# Patient Record
Sex: Female | Born: 1965 | Race: White | Hispanic: No | Smoking: Never smoker
Health system: Southern US, Community
[De-identification: ages and names within clinical notes are randomized; demographics above are authoritative.]

## PROBLEM LIST (undated history)

## (undated) DIAGNOSIS — E785 Hyperlipidemia, unspecified: Secondary | ICD-10-CM

## (undated) DIAGNOSIS — D649 Anemia, unspecified: Secondary | ICD-10-CM

## (undated) DIAGNOSIS — C539 Malignant neoplasm of cervix uteri, unspecified: Secondary | ICD-10-CM

## (undated) DIAGNOSIS — E079 Disorder of thyroid, unspecified: Secondary | ICD-10-CM

## (undated) HISTORY — DX: Anemia, unspecified: D64.9

## (undated) HISTORY — DX: Disorder of thyroid, unspecified: E07.9

## (undated) HISTORY — DX: Malignant neoplasm of cervix uteri, unspecified: C53.9

## (undated) HISTORY — DX: Hyperlipidemia, unspecified: E78.5

## (undated) HISTORY — PX: CERVICAL BIOPSY  W/ LOOP ELECTRODE EXCISION: SUR135

## (undated) HISTORY — PX: WISDOM TOOTH EXTRACTION: SHX21

---

## 1999-06-21 ENCOUNTER — Other Ambulatory Visit: Admission: RE | Admit: 1999-06-21 | Discharge: 1999-06-21 | Payer: Self-pay | Admitting: Obstetrics & Gynecology

## 1999-06-22 ENCOUNTER — Encounter (INDEPENDENT_AMBULATORY_CARE_PROVIDER_SITE_OTHER): Payer: Self-pay

## 1999-06-22 ENCOUNTER — Other Ambulatory Visit: Admission: RE | Admit: 1999-06-22 | Discharge: 1999-06-22 | Payer: Self-pay | Admitting: Obstetrics & Gynecology

## 2000-10-30 ENCOUNTER — Encounter: Admission: RE | Admit: 2000-10-30 | Discharge: 2000-10-30 | Payer: Self-pay | Admitting: Family Medicine

## 2000-10-30 ENCOUNTER — Encounter: Payer: Self-pay | Admitting: Family Medicine

## 2001-01-07 ENCOUNTER — Other Ambulatory Visit: Admission: RE | Admit: 2001-01-07 | Discharge: 2001-01-07 | Payer: Self-pay | Admitting: *Deleted

## 2001-01-07 ENCOUNTER — Encounter (INDEPENDENT_AMBULATORY_CARE_PROVIDER_SITE_OTHER): Payer: Self-pay | Admitting: Specialist

## 2001-03-12 ENCOUNTER — Encounter (INDEPENDENT_AMBULATORY_CARE_PROVIDER_SITE_OTHER): Payer: Self-pay

## 2001-03-12 ENCOUNTER — Other Ambulatory Visit: Admission: RE | Admit: 2001-03-12 | Discharge: 2001-03-12 | Payer: Self-pay | Admitting: Obstetrics & Gynecology

## 2001-07-10 ENCOUNTER — Other Ambulatory Visit: Admission: RE | Admit: 2001-07-10 | Discharge: 2001-07-10 | Payer: Self-pay | Admitting: Obstetrics & Gynecology

## 2001-12-01 ENCOUNTER — Other Ambulatory Visit: Admission: RE | Admit: 2001-12-01 | Discharge: 2001-12-01 | Payer: Self-pay | Admitting: Obstetrics & Gynecology

## 2006-11-07 ENCOUNTER — Encounter: Admission: RE | Admit: 2006-11-07 | Discharge: 2006-11-07 | Payer: Self-pay | Admitting: Family Medicine

## 2008-03-03 ENCOUNTER — Other Ambulatory Visit: Admission: RE | Admit: 2008-03-03 | Discharge: 2008-03-03 | Payer: Self-pay | Admitting: Family Medicine

## 2008-12-08 ENCOUNTER — Encounter: Admission: RE | Admit: 2008-12-08 | Discharge: 2008-12-08 | Payer: Self-pay

## 2014-01-13 ENCOUNTER — Ambulatory Visit: Payer: Self-pay | Admitting: Family Medicine

## 2014-01-13 ENCOUNTER — Ambulatory Visit: Payer: Self-pay

## 2014-01-13 VITALS — BP 90/60 | HR 77 | Temp 97.9°F | Resp 16 | Ht 61.0 in | Wt 162.0 lb

## 2014-01-13 DIAGNOSIS — M25519 Pain in unspecified shoulder: Secondary | ICD-10-CM

## 2014-01-13 DIAGNOSIS — M7540 Impingement syndrome of unspecified shoulder: Secondary | ICD-10-CM

## 2014-01-13 DIAGNOSIS — M7580 Other shoulder lesions, unspecified shoulder: Secondary | ICD-10-CM

## 2014-01-13 MED ORDER — MELOXICAM 7.5 MG PO TABS: 7.5000 mg | ORAL_TABLET | Freq: Every day | ORAL | Status: DC

## 2014-01-13 NOTE — Progress Notes (Signed)
      Chief Complaint:  Chief Complaint  Patient presents with  . Shoulder Pain    left shoulder injured it a few months ago  . Allergies    chest congestion    HPI: Kelsey Martinez is a 48 y.o. female who is here for  Left shoulder pain since Christmas after falling on ice, 4 months after direct fall onto shoulder she continues to have pain in the msk. Has had problems with overhead motion, combing hair, and also weakness with picking things up. Has not tried anythign for this. She has been tolerating it ok. Denies numbness, weakness, tingling.  Also complains of allergy symptoms. No sob, wheezing. Nonsmoker, no sore throat.   History reviewed. No pertinent past medical history. History reviewed. No pertinent past surgical history. History   Social History  . Marital Status: Divorced    Spouse Name: N/A    Number of Children: N/A  . Years of Education: N/A   Social History Main Topics  . Smoking status: Never Smoker   . Smokeless tobacco: None  . Alcohol Use: None  . Drug Use: None  . Sexual Activity: None   Other Topics Concern  . None   Social History Narrative  . None   Family History  Problem Relation Age of Onset  . Healthy Mother   . Cancer Father     Kidney cancer  . Healthy Sister    No Known Allergies Prior to Admission medications   Not on File     ROS: The patient denies fevers, chills, night sweats, unintentional weight loss, chest pain, palpitations, wheezing, dyspnea on exertion, nausea, vomiting, abdominal pain, dysuria, hematuria, melena, numbness, weakness, or tingling.   All other systems have been reviewed and were otherwise negative with the exception of those mentioned in the HPI and as above.    PHYSICAL EXAM: Filed Vitals:   01/13/14 1042  BP: 90/60  Pulse: 77  Temp: 97.9 F (36.6 C)  Resp: 16   Filed Vitals:   01/13/14 1042  Height: 5\' 1"  (1.549 m)  Weight: 162 lb (73.483 kg)   Body mass index is 30.63 kg/(m^2).  General:  Alert, no acute distress HEENT:  Normocephalic, atraumatic, oropharynx patent. EOMI, PERRLA. TM nl, boggy nares Cardiovascular:  Regular rate and rhythm, no rubs murmurs or gallops.  No Carotid bruits, radial pulse intact. No pedal edema.  Respiratory: Clear to auscultation bilaterally.  No wheezes, rales, or rhonchi.  No cyanosis, no use of accessory musculature GI: No organomegaly, abdomen is soft and non-tender, positive bowel sounds.  No masses. Skin: No rashes. Neurologic: Facial musculature symmetric. Psychiatric: Patient is appropriate throughout our interaction. Lymphatic: No cervical lymphadenopathy Musculoskeletal: Gait intact. Left-decreae ROM in IR Neg empty can 5/5 strength Tender along upper biceps.     LABS: No results found for this or any previous visit.   EKG/XRAY:   Primary read interpreted by Dr. Marin Comment at [facility]. Neg fracture/dislocation   ASSESSMENT/PLAN: Encounter Diagnoses  Name Primary?  . Shoulder pain Yes  . Rotator cuff tendonitis   . Rotator cuff impingement syndrome    Mobic prn Tylenol prn Decline other meds OTC nasocort F/u in 1 month  Gross sideeffects, risk and benefits, and alternatives of medications d/w patient. Patient is aware that all medications have potential sideeffects and we are unable to predict every sideeffect or drug-drug interaction that may occur.  Ebin Palazzi P Jshaun Abernathy, DO 01/13/2014 1:00 PM

## 2014-01-13 NOTE — Patient Instructions (Signed)

## 2015-11-15 ENCOUNTER — Ambulatory Visit (INDEPENDENT_AMBULATORY_CARE_PROVIDER_SITE_OTHER): Payer: BLUE CROSS/BLUE SHIELD | Admitting: Physician Assistant

## 2015-11-15 VITALS — BP 122/68 | HR 88 | Temp 98.7°F | Resp 17 | Ht 61.0 in | Wt 176.4 lb

## 2015-11-15 DIAGNOSIS — R05 Cough: Secondary | ICD-10-CM

## 2015-11-15 DIAGNOSIS — R059 Cough, unspecified: Secondary | ICD-10-CM

## 2015-11-15 LAB — POCT CBC
GRANULOCYTE PERCENT: 68.1 % (ref 37–80)
HCT, POC: 35.4 % — AB (ref 37.7–47.9)
HEMOGLOBIN: 11.5 g/dL — AB (ref 12.2–16.2)
Lymph, poc: 0.9 (ref 0.6–3.4)
MCH: 25.9 pg — AB (ref 27–31.2)
MCHC: 32.5 g/dL (ref 31.8–35.4)
MCV: 79.5 fL — AB (ref 80–97)
MID (CBC): 0.3 (ref 0–0.9)
MPV: 6 fL (ref 0–99.8)
PLATELET COUNT, POC: 280 10*3/uL (ref 142–424)
POC Granulocyte: 2.6 (ref 2–6.9)
POC LYMPH PERCENT: 22.7 %L (ref 10–50)
POC MID %: 9.2 % (ref 0–12)
RBC: 4.45 M/uL (ref 4.04–5.48)
RDW, POC: 16.9 %
WBC: 3.8 10*3/uL — AB (ref 4.6–10.2)

## 2015-11-15 MED ORDER — HYDROCODONE-HOMATROPINE 5-1.5 MG/5ML PO SYRP: 5.0000 mL | ORAL_SOLUTION | Freq: Three times a day (TID) | ORAL | Status: DC | PRN

## 2015-11-15 NOTE — Patient Instructions (Signed)
-   You have a viral upper respiratory infection, (the common cold) and it is not uncommon for symptoms to last 10 days to 2 weeks, regardless of which medications you take. Unfortunately antibiotics will not help your symptoms as they target bacteria, and you are likely suffering from a virus. Additionally, 1 in 8 people who take antibiotics suffer mild to severe side effects.    - You would benefit from high dose ibuprofen. TAKE 600-800 mg of ibuprofen every 8 hours as needed to control low grade fever, fatigue, and pain.    - I also recommend that you take Zyrtec-D 5-120 every morning or Claritin-D 10-240 for the next five to 10 days. This medication will help you with nasal congestion, post nasal drip and sneezing. You will have to purchase this directly from the pharmacist   Viral URI Medications: Please consult the pharmacist if you have questions. 600-800 mg ibuprofen every 8 hours as needed for the next five to ten days 5-120 Zyrtec-D every morning for five to 10 days OR Claritin D 10-240 every morning for five days.  Please visit the CDC's website [employer].cy to learn about appropriate and inappropriate uses of antibiotics.

## 2015-11-15 NOTE — Progress Notes (Signed)
   11/21/2015 7:15 PM   DOB: 18-Nov-1965 / MRN: [id number]  SUBJECTIVE:  Kelsey Martinez is a 50 y.o. female presenting for nasal congestion and runny nose that started 7 days ago.  Associated HA, chill, bad cough.  Has also been having loose stools.  Also associates some stress incontinence.  She has tried delsym for the cough and benadryl which have helped minimally.  She is a never smoker.  She denies a history of asthma.  She has not had the seasonal flu shot.    She has No Known Allergies.   She  has no past medical history on file.    She  reports that she has never smoked. She does not have any smokeless tobacco history on file. She  has no sexual activity history on file. The patient  has no past surgical history on file.  Her family history includes Cancer in her father; Healthy in her mother and sister.  Review of Systems  Constitutional: Negative for fever and chills.  Eyes: Negative for blurred vision.  Respiratory: Negative for cough and shortness of breath.   Cardiovascular: Negative for chest pain.  Gastrointestinal: Negative for nausea and abdominal pain.  Genitourinary: Negative for dysuria, urgency and frequency.  Musculoskeletal: Negative for myalgias.  Skin: Negative for rash.  Neurological: Negative for dizziness, tingling and headaches.  Psychiatric/Behavioral: Negative for depression. The patient is not nervous/anxious.     Problem list and medications reviewed and updated by myself where necessary, and exist elsewhere in the encounter.   OBJECTIVE:  BP 122/68 mmHg  Pulse 88  Temp(Src) 98.7 F (37.1 C) (Oral)  Resp 17  Ht 5\' 1"  (1.549 m)  Wt 176 lb 6.4 oz (80.015 kg)  BMI 33.35 kg/m2  SpO2 93%  LMP 12/16/2013  Physical Exam  Constitutional: She is oriented to person, place, and time. She appears well-developed.  Eyes: EOM are normal. Pupils are equal, round, and reactive to light.  Cardiovascular: Normal rate.   Pulmonary/Chest: Effort normal.  Abdominal:  She exhibits no distension.  Musculoskeletal: Normal range of motion.  Neurological: She is alert and oriented to person, place, and time. No cranial nerve deficit.  Skin: Skin is warm and dry. She is not diaphoretic.  Psychiatric: She has a normal mood and affect.  Vitals reviewed.   CBC    Component Value Date/Time   WBC 3.8* 11/15/2015 1126   RBC 4.45 11/15/2015 1126   HGB 11.5* 11/15/2015 1126   HCT 35.4* 11/15/2015 1126   MCV 79.5* 11/15/2015 1126   MCH 25.9* 11/15/2015 1126   MCHC 32.5 11/15/2015 1126      No results found.  ASSESSMENT AND PLAN  Kelsey Martinez was seen today for sinusitis and uri.  Diagnoses and all orders for this visit:  Cough: CBC reassuring.  This is most likely post viral cough syndrome.  Will treat symptomatically.  -     POCT CBC -     HYDROcodone-homatropine (HYCODAN) 5-1.5 MG/5ML syrup; Take 5 mLs by mouth every 8 (eight) hours as needed for cough.    The patient was advised to call or return to clinic if she does not see an improvement in symptoms or to seek the care of the closest emergency department if she worsens with the above plan.   Philis Fendt, MHS, PA-C [facility] 11/21/2015 7:15 PM

## 2015-12-05 ENCOUNTER — Encounter: Payer: Self-pay | Admitting: Medical

## 2015-12-05 ENCOUNTER — Encounter: Payer: Self-pay | Admitting: Gastroenterology

## 2015-12-05 ENCOUNTER — Ambulatory Visit (INDEPENDENT_AMBULATORY_CARE_PROVIDER_SITE_OTHER): Payer: BLUE CROSS/BLUE SHIELD | Admitting: Medical

## 2015-12-05 VITALS — BP 90/60 | HR 80 | Temp 97.7°F | Resp 16 | Ht 60.5 in | Wt 179.0 lb

## 2015-12-05 DIAGNOSIS — N393 Stress incontinence (female) (male): Secondary | ICD-10-CM

## 2015-12-05 DIAGNOSIS — D649 Anemia, unspecified: Secondary | ICD-10-CM

## 2015-12-05 DIAGNOSIS — Z8742 Personal history of other diseases of the female genital tract: Secondary | ICD-10-CM | POA: Diagnosis not present

## 2015-12-05 DIAGNOSIS — Z1211 Encounter for screening for malignant neoplasm of colon: Secondary | ICD-10-CM | POA: Diagnosis not present

## 2015-12-05 DIAGNOSIS — Z23 Encounter for immunization: Secondary | ICD-10-CM

## 2015-12-05 LAB — IRON AND TIBC
%SAT: 7 % — ABNORMAL LOW (ref 11–50)
Iron: 29 ug/dL — ABNORMAL LOW (ref 45–160)
TIBC: 390 ug/dL (ref 250–450)
UIBC: 361 ug/dL (ref 125–400)

## 2015-12-05 LAB — CBC WITH DIFFERENTIAL/PLATELET
BASOS PCT: 0.6 % (ref 0.0–3.0)
Basophils Absolute: 0 10*3/uL (ref 0.0–0.1)
EOS ABS: 0.1 10*3/uL (ref 0.0–0.7)
EOS PCT: 1.7 % (ref 0.0–5.0)
HEMATOCRIT: 32.9 % — AB (ref 36.0–46.0)
HEMOGLOBIN: 10.8 g/dL — AB (ref 12.0–15.0)
LYMPHS PCT: 37.5 % (ref 12.0–46.0)
Lymphs Abs: 2.1 10*3/uL (ref 0.7–4.0)
MCHC: 32.7 g/dL (ref 30.0–36.0)
MCV: 79.7 fl (ref 78.0–100.0)
Monocytes Absolute: 0.3 10*3/uL (ref 0.1–1.0)
Monocytes Relative: 5.3 % (ref 3.0–12.0)
Neutro Abs: 3 10*3/uL (ref 1.4–7.7)
Neutrophils Relative %: 54.9 % (ref 43.0–77.0)
Platelets: 341 10*3/uL (ref 150.0–400.0)
RBC: 4.13 Mil/uL (ref 3.87–5.11)
RDW: 16.8 % — AB (ref 11.5–15.5)
WBC: 5.5 10*3/uL (ref 4.0–10.5)

## 2015-12-05 LAB — FERRITIN: Ferritin: 5.8 ng/mL — ABNORMAL LOW (ref 10.0–291.0)

## 2015-12-05 MED ORDER — OXYBUTYNIN CHLORIDE 5 MG PO TABS: 5.0000 mg | ORAL_TABLET | Freq: Two times a day (BID) | ORAL | Status: DC

## 2015-12-05 NOTE — Progress Notes (Signed)
Pre visit review using our clinic review tool, if applicable. No additional management support is needed unless otherwise documented below in the visit note. 

## 2015-12-05 NOTE — Patient Instructions (Addendum)
Will get labs today for anemia.   Refer to gyn for pap, and mammogram.  Refer to GI for colonoscopy.  You do describe stress incontinence. May some urge component. Will rx ditropan as trial.  Tdap given.   Follow up in 3 wks for CPE. Come in fasting   Kegel Exercises The goal of Kegel exercises is to isolate and exercise your pelvic floor muscles. These muscles act as a hammock that supports the rectum, vagina, small intestine, and uterus. As the muscles weaken, the hammock sags and these organs are displaced from their normal positions. Kegel exercises can strengthen your pelvic floor muscles and help you to improve bladder and bowel control, improve sexual response, and help reduce many problems and some discomfort during pregnancy. Kegel exercises can be done anywhere and at any time. HOW TO PERFORM KEGEL EXERCISES 1. Locate your pelvic floor muscles. To do this, squeeze (contract) the muscles that you use when you try to stop the flow of urine. You will feel a tightness in the vaginal area (women) and a tight lift in the rectal area (men and women). 2. When you begin, contract your pelvic muscles tight for 2-5 seconds, then relax them for 2-5 seconds. This is one set. Do 4-5 sets with a short pause in between. 3. Contract your pelvic muscles for 8-10 seconds, then relax them for 8-10 seconds. Do 4-5 sets. If you cannot contract your pelvic muscles for 8-10 seconds, try 5-7 seconds and work your way up to 8-10 seconds. Your goal is 4-5 sets of 10 contractions each day. Keep your stomach, buttocks, and legs relaxed during the exercises. Perform sets of both short and long contractions. Vary your positions. Perform these contractions 3-4 times per day. Perform sets while you are:   Lying in bed in the morning.  Standing at lunch.  Sitting in the late afternoon.  Lying in bed at night. You should do 40-50 contractions per day. Do not perform more Kegel exercises per day than  recommended. Overexercising can cause muscle fatigue. Continue these exercises for for at least 15-20 weeks or as directed by your caregiver.   This information is not intended to replace advice given to you by your health care provider. Make sure you discuss any questions you have with your health care provider.   Document Released: 09/10/2012 Document Revised: 10/15/2014 Document Reviewed: 09/10/2012 Elsevier Interactive Patient Education [employer].

## 2015-12-05 NOTE — Progress Notes (Signed)
Subjective:    Patient ID: Kelsey Martinez, female    DOB: [birth year], 50 y.o.   MRN: [id number]  HPI   Pt in for evaluation. First time. Pt states last seen in 2010. No physical done since then.  Pt does not exercise regularly but estimates exercises 3 times a week, Pt drinks Dr. Malachi Bonds 2 a day, Pt attempting to eat healthy, married(6 year grandson lives with her)  Pt has history of cervical cancer. Pt had procedure to remove large portion of cervix. Then she states tissue can back and tested. No further cancer cells found. This was done in 2000. Pt states last pap was in 2010. None since.  Pt states faint mild anemia found in early February(when blood work done in urgent care. She had some cold symptoms at that time and dizzy.). Sometimes dizzy on bending over and standing up quickly.   Also some room spinning when she lies flat on her back and then turns her head. Will last 1-2 minutes then will resolve. Seemed to follow cold.  Pt currently denies any allergy symptoms.  Pt willing to get tdap vaccine today. Pt declines flu vaccine.  Pt states some occasional leaking when she coughs and sneezing. Going on for 20 years. Pt has done Kegel exercises.    Past Medical History  Diagnosis Date  . Cervical cancer ([place])   . Anemia     Social History   Social History  . Marital Status: Divorced    Spouse Name: N/A  . Number of Children: N/A  . Years of Education: N/A   Occupational History  . Not on file.   Social History Main Topics  . Smoking status: Never Smoker   . Smokeless tobacco: Not on file  . Alcohol Use: No  . Drug Use: No  . Sexual Activity: Yes   Other Topics Concern  . Not on file   Social History Narrative    Past Surgical History  Procedure Laterality Date  . Cervical biopsy  w/ loop electrode excision      this is what pt describes.    Family History  Problem Relation Age of Onset  . Healthy Mother   . Cancer Father     Kidney cancer  . Healthy  Sister     No Known Allergies  No current outpatient prescriptions on file prior to visit.   No current facility-administered medications on file prior to visit.    BP 90/60 mmHg  Pulse 80  Temp(Src) 97.7 F (36.5 C) (Oral)  Resp 16  Ht 5' 0.5" (1.537 m)  Wt 179 lb (81.194 kg)  BMI 34.37 kg/m2  SpO2 98%  LMP 12/16/2013     Review of Systems  Constitutional: Negative for fever, chills and fatigue.  Respiratory: Negative for cough, chest tightness, shortness of breath and wheezing.   Cardiovascular: Negative for chest pain and palpitations.  Gastrointestinal: Negative for abdominal pain.  Genitourinary: Negative for urgency, enuresis, difficulty urinating and genital sores.       Occasional incontinence on urinating.  Musculoskeletal: Negative for back pain and arthralgias.  Skin: Negative for rash.  Neurological: Positive for dizziness. Negative for headaches.       See hpi  Hematological: Negative for adenopathy. Does not bruise/bleed easily.  Psychiatric/Behavioral: Negative for behavioral problems and confusion.       Objective:   Physical Exam   General Mental Status- Alert. General Appearance- Not in acute distress.   Skin General: Color- Normal Color. Moisture- Normal  Moisture.  Neck Carotid Arteries- Normal color. Moisture- Normal Moisture. No carotid bruits. No JVD.  Chest and Lung Exam Auscultation: Breath Sounds:-Normal.  Cardiovascular Auscultation:Rythm- Regular. Murmurs & Other Heart Sounds:Auscultation of the heart reveals- No Murmurs.  Abdomen Inspection:-Inspeection Normal. Palpation/Percussion:Note:No mass. Palpation and Percussion of the abdomen reveal- Non Tender, Non Distended + BS, no rebound or guarding.    Neurologic Cranial Nerve exam:- CN III-XII intact(No nystagmus), symmetric smile. Strength:- 5/5 equal and symmetric strength both upper and lower extremities. Lying supine. She will get vertigo for 30 seconds at best then  subsides.     Assessment & Plan:  Will get labs today for anemia.   Refer to gyn for pap, and mammogram.  Refer to GI for colonoscopy.  You do describe stress incontinence. May some urge component. Will rx ditropan as trial.  Tdap given.   Follow up in 3 wks for CPE. Come in fasting

## 2015-12-06 ENCOUNTER — Telehealth: Payer: Self-pay | Admitting: Medical

## 2015-12-06 MED ORDER — FERROUS SULFATE 325 (65 FE) MG PO TABS: 325.0000 mg | ORAL_TABLET | Freq: Two times a day (BID) | ORAL | Status: DC

## 2015-12-06 NOTE — Telephone Encounter (Signed)
rx iron sent in.

## 2015-12-13 ENCOUNTER — Other Ambulatory Visit: Payer: Self-pay | Admitting: Obstetrics and Gynecology

## 2015-12-13 DIAGNOSIS — N6315 Unspecified lump in the right breast, overlapping quadrants: Secondary | ICD-10-CM

## 2015-12-13 DIAGNOSIS — N6310 Unspecified lump in the right breast, unspecified quadrant: Principal | ICD-10-CM

## 2015-12-14 LAB — HM DEXA SCAN

## 2015-12-16 ENCOUNTER — Ambulatory Visit
Admission: RE | Admit: 2015-12-16 | Discharge: 2015-12-16 | Disposition: A | Discharge status: Home / Self Care | Payer: BLUE CROSS/BLUE SHIELD | Source: Ambulatory Visit | Attending: Obstetrics and Gynecology | Admitting: Obstetrics and Gynecology

## 2015-12-16 DIAGNOSIS — N6310 Unspecified lump in the right breast, unspecified quadrant: Principal | ICD-10-CM

## 2015-12-16 DIAGNOSIS — N6315 Unspecified lump in the right breast, overlapping quadrants: Secondary | ICD-10-CM

## 2015-12-16 LAB — HM PAP SMEAR: HM Pap smear: NORMAL

## 2015-12-23 ENCOUNTER — Telehealth: Payer: Self-pay | Admitting: Behavioral Health

## 2015-12-23 ENCOUNTER — Telehealth: Payer: Self-pay | Admitting: Medical

## 2015-12-23 ENCOUNTER — Encounter: Payer: Self-pay | Admitting: Behavioral Health

## 2015-12-23 MED ORDER — FERROUS SULFATE 325 (65 FE) MG PO TABS: 325.0000 mg | ORAL_TABLET | Freq: Two times a day (BID) | ORAL | Status: DC

## 2015-12-23 NOTE — Telephone Encounter (Signed)
i don't remember her telling me she has low vitamin D. It would depend if she is low. I don't see I did vitamin d test. Does she have insurance? If she does not then I quess we could go ahead and order test. Still not sure what code epic would take. I usually use osteopenia, osteoporosis, or ovarian failure(has she gone through menopause?0. Would like to use something that she has. First need to know if has low vitamin d dx. If so then we could use that dx. Does she have old value of vitamin d/lab work.

## 2015-12-23 NOTE — Telephone Encounter (Signed)
Relation to [id number] Call back number: Pharmacy: [id number] - [place], [place]. [phone] (Phone) [phone] (Fax)         Reason for call:  Patient states her Vitamin D medication was thrown away requesting Rx please send to retail. (medicaiton list doesn't reflect Rx)

## 2015-12-23 NOTE — Telephone Encounter (Signed)
Kelsey Martinez please advise if ok to send in iron rx, Its not showing on current medication list.

## 2015-12-23 NOTE — Telephone Encounter (Signed)
I did send in iron to her pharmacy. It is written as twice daily. Can take it that way provided not getting constipated. If she is constipated can cut back to one tablet a day. Her anemia is not that low so maybe on tablet enough. But please remind her and document that I wanted her to get ifob. Make sure no blood in her stool. Part of anemia work up. Repeat cbc and iron studies in 2 months. Sooner if she is feeling fatigued.

## 2015-12-23 NOTE — Telephone Encounter (Signed)
Pre-Visit Call completed with patient and chart updated.   Pre-Visit Info documented in Specialty Comments under [employer].    

## 2015-12-23 NOTE — Telephone Encounter (Signed)
I meant to put in Vit. D. Does she need to take otc vit d or does she need an RX?

## 2015-12-23 NOTE — Telephone Encounter (Signed)
Spoke with pt and she states that she found Rx and did not need a refill at this time. Pt will call back if she needs any refills. Pt states that she called earlier and advised front that she would not need a call back.

## 2015-12-26 ENCOUNTER — Ambulatory Visit (INDEPENDENT_AMBULATORY_CARE_PROVIDER_SITE_OTHER): Payer: BLUE CROSS/BLUE SHIELD | Admitting: Medical

## 2015-12-26 ENCOUNTER — Telehealth: Payer: Self-pay | Admitting: Medical

## 2015-12-26 ENCOUNTER — Encounter: Payer: Self-pay | Admitting: Medical

## 2015-12-26 VITALS — BP 88/60 | HR 66 | Temp 97.9°F | Ht 61.0 in | Wt 176.4 lb

## 2015-12-26 DIAGNOSIS — Z0000 Encounter for general adult medical examination without abnormal findings: Secondary | ICD-10-CM

## 2015-12-26 DIAGNOSIS — Z113 Encounter for screening for infections with a predominantly sexual mode of transmission: Secondary | ICD-10-CM

## 2015-12-26 DIAGNOSIS — L989 Disorder of the skin and subcutaneous tissue, unspecified: Secondary | ICD-10-CM

## 2015-12-26 LAB — CBC WITH DIFFERENTIAL/PLATELET
BASOS PCT: 0.6 % (ref 0.0–3.0)
Basophils Absolute: 0 10*3/uL (ref 0.0–0.1)
EOS ABS: 0.1 10*3/uL (ref 0.0–0.7)
Eosinophils Relative: 1.3 % (ref 0.0–5.0)
HCT: 37 % (ref 36.0–46.0)
HEMOGLOBIN: 12 g/dL (ref 12.0–15.0)
LYMPHS ABS: 2.3 10*3/uL (ref 0.7–4.0)
Lymphocytes Relative: 39 % (ref 12.0–46.0)
MCHC: 32.5 g/dL (ref 30.0–36.0)
MCV: 82.8 fl (ref 78.0–100.0)
MONO ABS: 0.3 10*3/uL (ref 0.1–1.0)
Monocytes Relative: 4.8 % (ref 3.0–12.0)
NEUTROS PCT: 54.3 % (ref 43.0–77.0)
Neutro Abs: 3.2 10*3/uL (ref 1.4–7.7)
Platelets: 300 10*3/uL (ref 150.0–400.0)
RBC: 4.47 Mil/uL (ref 3.87–5.11)
RDW: 19.8 % — AB (ref 11.5–15.5)
WBC: 5.8 10*3/uL (ref 4.0–10.5)

## 2015-12-26 LAB — TSH: TSH: 6.33 u[IU]/mL — AB (ref 0.35–4.50)

## 2015-12-26 LAB — LIPID PANEL
CHOLESTEROL: 226 mg/dL — AB (ref 0–200)
HDL: 41.1 mg/dL (ref >39.00)
LDL CALC: 156 mg/dL — AB (ref 0–99)
NonHDL: 185.25
TRIGLYCERIDES: 148 mg/dL (ref 0.0–149.0)
Total CHOL/HDL Ratio: 6
VLDL: 29.6 mg/dL (ref 0.0–40.0)

## 2015-12-26 LAB — COMPREHENSIVE METABOLIC PANEL
ALBUMIN: 4.1 g/dL (ref 3.5–5.2)
ALT: 9 U/L (ref 0–35)
AST: 13 U/L (ref 0–37)
Alkaline Phosphatase: 53 U/L (ref 39–117)
BILIRUBIN TOTAL: 0.4 mg/dL (ref 0.2–1.2)
BUN: 12 mg/dL (ref 6–23)
CALCIUM: 9.1 mg/dL (ref 8.4–10.5)
CHLORIDE: 106 meq/L (ref 96–112)
CO2: 25 meq/L (ref 19–32)
Creatinine, Ser: 0.66 mg/dL (ref 0.40–1.20)
GFR: 100.51 mL/min (ref >60.00)
Glucose, Bld: 93 mg/dL (ref 70–99)
Potassium: 3.9 mEq/L (ref 3.5–5.1)
Sodium: 139 mEq/L (ref 135–145)
Total Protein: 6.9 g/dL (ref 6.0–8.3)

## 2015-12-26 LAB — HIV ANTIBODY (ROUTINE TESTING W REFLEX): HIV: NONREACTIVE

## 2015-12-26 MED ORDER — LEVOTHYROXINE SODIUM 75 MCG PO TABS: 75.0000 ug | ORAL_TABLET | Freq: Every day | ORAL | Status: DC

## 2015-12-26 NOTE — Assessment & Plan Note (Signed)
Cbc, cmp, tsh, lipid panel, hiv screen and ua.

## 2015-12-26 NOTE — Patient Instructions (Addendum)
Wellness examination Cbc, cmp, tsh, lipid panel, hiv screen and ua.  Would recommend you continue healthy diet and exercise. Will follow labs and notify you of the results.   We can repeat your vitamin D level that gyn did the other day.Can repeat it when you finish we vitamin D RX.Kelsey Martinez  Follow up date to be determined after lab review.       Preventive Care for Adults, Female A healthy lifestyle and preventive care can promote health and wellness. Preventive health guidelines for women include the following key practices.  A routine yearly physical is a good way to check with your health care provider about your health and preventive screening. It is a chance to share any concerns and updates on your health and to receive a thorough exam.  Visit your dentist for a routine exam and preventive care every 6 months. Brush your teeth twice a day and floss once a day. Good oral hygiene prevents tooth decay and gum disease.  The frequency of eye exams is based on your age, health, family medical history, use of contact lenses, and other factors. Follow your health care provider's recommendations for frequency of eye exams.  Eat a healthy diet. Foods like vegetables, fruits, whole grains, low-fat dairy products, and lean protein foods contain the nutrients you need without too many calories. Decrease your intake of foods high in solid fats, added sugars, and salt. Eat the right amount of calories for you.Get information about a proper diet from your health care provider, if necessary.  Regular physical exercise is one of the most important things you can do for your health. Most adults should get at least 150 minutes of moderate-intensity exercise (any activity that increases your heart rate and causes you to sweat) each week. In addition, most adults need muscle-strengthening exercises on 2 or more days a week.  Maintain a healthy weight. The body mass index (BMI) is a screening tool to identify  possible weight problems. It provides an estimate of body fat based on height and weight. Your health care provider can find your BMI and can help you achieve or maintain a healthy weight.For adults 20 years and older:  A BMI below 18.5 is considered underweight.  A BMI of 18.5 to 24.9 is normal.  A BMI of 25 to 29.9 is considered overweight.  A BMI of 30 and above is considered obese.  Maintain normal blood lipids and cholesterol levels by exercising and minimizing your intake of saturated fat. Eat a balanced diet with plenty of fruit and vegetables. Blood tests for lipids and cholesterol should begin at age 25 and be repeated every 5 years. If your lipid or cholesterol levels are high, you are over 50, or you are at high risk for heart disease, you may need your cholesterol levels checked more frequently.Ongoing high lipid and cholesterol levels should be treated with medicines if diet and exercise are not working.  If you smoke, find out from your health care provider how to quit. If you do not use tobacco, do not start.  Lung cancer screening is recommended for adults aged 36-80 years who are at high risk for developing lung cancer because of a history of smoking. A yearly low-dose CT scan of the lungs is recommended for people who have at least a 30-pack-year history of smoking and are a current smoker or have quit within the past 15 years. A pack year of smoking is smoking an average of 1 pack of cigarettes a  day for 1 year (for example: 1 pack a day for 30 years or 2 packs a day for 15 years). Yearly screening should continue until the smoker has stopped smoking for at least 15 years. Yearly screening should be stopped for people who develop a health problem that would prevent them from having lung cancer treatment.  If you are pregnant, do not drink alcohol. If you are breastfeeding, be very cautious about drinking alcohol. If you are not pregnant and choose to drink alcohol, do not have  more than 1 drink per day. One drink is considered to be 12 ounces (355 mL) of beer, 5 ounces (148 mL) of wine, or 1.5 ounces (44 mL) of liquor.  Avoid use of street drugs. Do not share needles with anyone. Ask for help if you need support or instructions about stopping the use of drugs.  High blood pressure causes heart disease and increases the risk of stroke. Your blood pressure should be checked at least every 1 to 2 years. Ongoing high blood pressure should be treated with medicines if weight loss and exercise do not work.  If you are 83-58 years old, ask your health care provider if you should take aspirin to prevent strokes.  Diabetes screening is done by taking a blood sample to check your blood glucose level after you have not eaten for a certain period of time (fasting). If you are not overweight and you do not have risk factors for diabetes, you should be screened once every 3 years starting at age 90. If you are overweight or obese and you are 59-27 years of age, you should be screened for diabetes every year as part of your cardiovascular risk assessment.  Breast cancer screening is essential preventive care for women. You should practice "breast self-awareness." This means understanding the normal appearance and feel of your breasts and may include breast self-examination. Any changes detected, no matter how small, should be reported to a health care provider. Women in their 49s and 30s should have a clinical breast exam (CBE) by a health care provider as part of a regular health exam every 1 to 3 years. After age 63, women should have a CBE every year. Starting at age 24, women should consider having a mammogram (breast X-ray test) every year. Women who have a family history of breast cancer should talk to their health care provider about genetic screening. Women at a high risk of breast cancer should talk to their health care providers about having an MRI and a mammogram every  year.  Breast cancer gene (BRCA)-related cancer risk assessment is recommended for women who have family members with BRCA-related cancers. BRCA-related cancers include breast, ovarian, tubal, and peritoneal cancers. Having family members with these cancers may be associated with an increased risk for harmful changes (mutations) in the breast cancer genes BRCA1 and BRCA2. Results of the assessment will determine the need for genetic counseling and BRCA1 and BRCA2 testing.  Your health care provider may recommend that you be screened regularly for cancer of the pelvic organs (ovaries, uterus, and vagina). This screening involves a pelvic examination, including checking for microscopic changes to the surface of your cervix (Pap test). You may be encouraged to have this screening done every 3 years, beginning at age 46.  For women ages 79-65, health care providers may recommend pelvic exams and Pap testing every 3 years, or they may recommend the Pap and pelvic exam, combined with testing for human papilloma virus (HPV), every  5 years. Some types of HPV increase your risk of cervical cancer. Testing for HPV may also be done on women of any age with unclear Pap test results.  Other health care providers may not recommend any screening for nonpregnant women who are considered low risk for pelvic cancer and who do not have symptoms. Ask your health care provider if a screening pelvic exam is right for you.  If you have had past treatment for cervical cancer or a condition that could lead to cancer, you need Pap tests and screening for cancer for at least 20 years after your treatment. If Pap tests have been discontinued, your risk factors (such as having a new sexual partner) need to be reassessed to determine if screening should resume. Some women have medical problems that increase the chance of getting cervical cancer. In these cases, your health care provider may recommend more frequent screening and Pap  tests.  Colorectal cancer can be detected and often prevented. Most routine colorectal cancer screening begins at the age of 56 years and continues through age 17 years. However, your health care provider may recommend screening at an earlier age if you have risk factors for colon cancer. On a yearly basis, your health care provider may provide home test kits to check for hidden blood in the stool. Use of a small camera at the end of a tube, to directly examine the colon (sigmoidoscopy or colonoscopy), can detect the earliest forms of colorectal cancer. Talk to your health care provider about this at age 37, when routine screening begins. Direct exam of the colon should be repeated every 5-10 years through age 59 years, unless early forms of precancerous polyps or small growths are found.  People who are at an increased risk for hepatitis B should be screened for this virus. You are considered at high risk for hepatitis B if:  You were born in a country where hepatitis B occurs often. Talk with your health care provider about which countries are considered high risk.  Your parents were born in a high-risk country and you have not received a shot to protect against hepatitis B (hepatitis B vaccine).  You have HIV or AIDS.  You use needles to inject street drugs.  You live with, or have sex with, someone who has hepatitis B.  You get hemodialysis treatment.  You take certain medicines for conditions like cancer, organ transplantation, and autoimmune conditions.  Hepatitis C blood testing is recommended for all people born from [birth year] through 1965 and any individual with known risks for hepatitis C.  Practice safe sex. Use condoms and avoid high-risk sexual practices to reduce the spread of sexually transmitted infections (STIs). STIs include gonorrhea, chlamydia, syphilis, trichomonas, herpes, HPV, and human immunodeficiency virus (HIV). Herpes, HIV, and HPV are viral illnesses that have no cure.  They can result in disability, cancer, and death.  You should be screened for sexually transmitted illnesses (STIs) including gonorrhea and chlamydia if:  You are sexually active and are younger than 24 years.  You are older than 24 years and your health care provider tells you that you are at risk for this type of infection.  Your sexual activity has changed since you were last screened and you are at an increased risk for chlamydia or gonorrhea. Ask your health care provider if you are at risk.  If you are at risk of being infected with HIV, it is recommended that you take a prescription medicine daily to prevent HIV  infection. This is called preexposure prophylaxis (PrEP). You are considered at risk if:  You are sexually active and do not regularly use condoms or know the HIV status of your partner(s).  You take drugs by injection.  You are sexually active with a partner who has HIV.  Talk with your health care provider about whether you are at high risk of being infected with HIV. If you choose to begin PrEP, you should first be tested for HIV. You should then be tested every 3 months for as long as you are taking PrEP.  Osteoporosis is a disease in which the bones lose minerals and strength with aging. This can result in serious bone fractures or breaks. The risk of osteoporosis can be identified using a bone density scan. Women ages 49 years and over and women at risk for fractures or osteoporosis should discuss screening with their health care providers. Ask your health care provider whether you should take a calcium supplement or vitamin D to reduce the rate of osteoporosis.  Menopause can be associated with physical symptoms and risks. Hormone replacement therapy is available to decrease symptoms and risks. You should talk to your health care provider about whether hormone replacement therapy is right for you.  Use sunscreen. Apply sunscreen liberally and repeatedly throughout the  day. You should seek shade when your shadow is shorter than you. Protect yourself by wearing long sleeves, pants, a wide-brimmed hat, and sunglasses year round, whenever you are outdoors.  Once a month, do a whole body skin exam, using a mirror to look at the skin on your back. Tell your health care provider of new moles, moles that have irregular borders, moles that are larger than a pencil eraser, or moles that have changed in shape or color.  Stay current with required vaccines (immunizations).  Influenza vaccine. All adults should be immunized every year.  Tetanus, diphtheria, and acellular pertussis (Td, Tdap) vaccine. Pregnant women should receive 1 dose of Tdap vaccine during each pregnancy. The dose should be obtained regardless of the length of time since the last dose. Immunization is preferred during the 27th-36th week of gestation. An adult who has not previously received Tdap or who does not know her vaccine status should receive 1 dose of Tdap. This initial dose should be followed by tetanus and diphtheria toxoids (Td) booster doses every 10 years. Adults with an unknown or incomplete history of completing a 3-dose immunization series with Td-containing vaccines should begin or complete a primary immunization series including a Tdap dose. Adults should receive a Td booster every 10 years.  Varicella vaccine. An adult without evidence of immunity to varicella should receive 2 doses or a second dose if she has previously received 1 dose. Pregnant females who do not have evidence of immunity should receive the first dose after pregnancy. This first dose should be obtained before leaving the health care facility. The second dose should be obtained 4-8 weeks after the first dose.  Human papillomavirus (HPV) vaccine. Females aged 13-26 years who have not received the vaccine previously should obtain the 3-dose series. The vaccine is not recommended for use in pregnant females. However, pregnancy  testing is not needed before receiving a dose. If a female is found to be pregnant after receiving a dose, no treatment is needed. In that case, the remaining doses should be delayed until after the pregnancy. Immunization is recommended for any person with an immunocompromised condition through the age of 55 years if she did not  get any or all doses earlier. During the 3-dose series, the second dose should be obtained 4-8 weeks after the first dose. The third dose should be obtained 24 weeks after the first dose and 16 weeks after the second dose.  Zoster vaccine. One dose is recommended for adults aged 68 years or older unless certain conditions are present.  Measles, mumps, and rubella (MMR) vaccine. Adults born before [birth year] generally are considered immune to measles and mumps. Adults born in [birth year] or later should have 1 or more doses of MMR vaccine unless there is a contraindication to the vaccine or there is laboratory evidence of immunity to each of the three diseases. A routine second dose of MMR vaccine should be obtained at least 28 days after the first dose for students attending postsecondary schools, health care workers, or international travelers. People who received inactivated measles vaccine or an unknown type of measles vaccine during 1963-1967 should receive 2 doses of MMR vaccine. People who received inactivated mumps vaccine or an unknown type of mumps vaccine before 1979 and are at high risk for mumps infection should consider immunization with 2 doses of MMR vaccine. For females of childbearing age, rubella immunity should be determined. If there is no evidence of immunity, females who are not pregnant should be vaccinated. If there is no evidence of immunity, females who are pregnant should delay immunization until after pregnancy. Unvaccinated health care workers born before [birth year] who lack laboratory evidence of measles, mumps, or rubella immunity or laboratory confirmation of disease should  consider measles and mumps immunization with 2 doses of MMR vaccine or rubella immunization with 1 dose of MMR vaccine.  Pneumococcal 13-valent conjugate (PCV13) vaccine. When indicated, a person who is uncertain of his immunization history and has no record of immunization should receive the PCV13 vaccine. All adults 38 years of age and older should receive this vaccine. An adult aged 47 years or older who has certain medical conditions and has not been previously immunized should receive 1 dose of PCV13 vaccine. This PCV13 should be followed with a dose of pneumococcal polysaccharide (PPSV23) vaccine. Adults who are at high risk for pneumococcal disease should obtain the PPSV23 vaccine at least 8 weeks after the dose of PCV13 vaccine. Adults older than 51 years of age who have normal immune system function should obtain the PPSV23 vaccine dose at least 1 year after the dose of PCV13 vaccine.  Pneumococcal polysaccharide (PPSV23) vaccine. When PCV13 is also indicated, PCV13 should be obtained first. All adults aged 74 years and older should be immunized. An adult younger than age 57 years who has certain medical conditions should be immunized. Any person who resides in a nursing home or long-term care facility should be immunized. An adult smoker should be immunized. People with an immunocompromised condition and certain other conditions should receive both PCV13 and PPSV23 vaccines. People with human immunodeficiency virus (HIV) infection should be immunized as soon as possible after diagnosis. Immunization during chemotherapy or radiation therapy should be avoided. Routine use of PPSV23 vaccine is not recommended for American Indians, [place] Natives, or people younger than 65 years unless there are medical conditions that require PPSV23 vaccine. When indicated, people who have unknown immunization and have no record of immunization should receive PPSV23 vaccine. One-time revaccination 5 years after the first  dose of PPSV23 is recommended for people aged 19-64 years who have chronic kidney failure, nephrotic syndrome, asplenia, or immunocompromised conditions. People who received 1-2 doses of PPSV23 before age  65 years should receive another dose of PPSV23 vaccine at age 57 years or later if at least 5 years have passed since the previous dose. Doses of PPSV23 are not needed for people immunized with PPSV23 at or after age 32 years.  Meningococcal vaccine. Adults with asplenia or persistent complement component deficiencies should receive 2 doses of quadrivalent meningococcal conjugate (MenACWY-D) vaccine. The doses should be obtained at least 2 months apart. Microbiologists working with certain meningococcal bacteria, military recruits, people at risk during an outbreak, and people who travel to or live in countries with a high rate of meningitis should be immunized. A first-year college student up through age 23 years who is living in a residence hall should receive a dose if she did not receive a dose on or after her 16th birthday. Adults who have certain high-risk conditions should receive one or more doses of vaccine.  Hepatitis A vaccine. Adults who wish to be protected from this disease, have certain high-risk conditions, work with hepatitis A-infected animals, work in hepatitis A research labs, or travel to or work in countries with a high rate of hepatitis A should be immunized. Adults who were previously unvaccinated and who anticipate close contact with an international adoptee during the first 60 days after arrival in the [place] from a country with a high rate of hepatitis A should be immunized.  Hepatitis B vaccine. Adults who wish to be protected from this disease, have certain high-risk conditions, may be exposed to blood or other infectious body fluids, are household contacts or sex partners of hepatitis B positive people, are clients or workers in certain care facilities, or travel to or  work in countries with a high rate of hepatitis B should be immunized.  Haemophilus influenzae type b (Hib) vaccine. A previously unvaccinated person with asplenia or sickle cell disease or having a scheduled splenectomy should receive 1 dose of Hib vaccine. Regardless of previous immunization, a recipient of a hematopoietic stem cell transplant should receive a 3-dose series 6-12 months after her successful transplant. Hib vaccine is not recommended for adults with HIV infection. Preventive Services / Frequency Ages 82 to 39 years  Blood pressure check.** / Every 3-5 years.  Lipid and cholesterol check.** / Every 5 years beginning at age 84.  Clinical breast exam.** / Every 3 years for women in their 50s and 30s.  BRCA-related cancer risk assessment.** / For women who have family members with a BRCA-related cancer (breast, ovarian, tubal, or peritoneal cancers).  Pap test.** / Every 2 years from ages 42 through 81. Every 3 years starting at age 46 through age 25 or 38 with a history of 3 consecutive normal Pap tests.  HPV screening.** / Every 3 years from ages 1 through ages 24 to 66 with a history of 3 consecutive normal Pap tests.  Hepatitis C blood test.** / For any individual with known risks for hepatitis C.  Skin self-exam. / Monthly.  Influenza vaccine. / Every year.  Tetanus, diphtheria, and acellular pertussis (Tdap, Td) vaccine.** / Consult your health care provider. Pregnant women should receive 1 dose of Tdap vaccine during each pregnancy. 1 dose of Td every 10 years.  Varicella vaccine.** / Consult your health care provider. Pregnant females who do not have evidence of immunity should receive the first dose after pregnancy.  HPV vaccine. / 3 doses over 6 months, if 26 and younger. The vaccine is not recommended for use in pregnant females. However, pregnancy testing is not needed  before receiving a dose.  Measles, mumps, rubella (MMR) vaccine.** / You need at least 1 dose  of MMR if you were born in 1957 or later. You may also need a 2nd dose. For females of childbearing age, rubella immunity should be determined. If there is no evidence of immunity, females who are not pregnant should be vaccinated. If there is no evidence of immunity, females who are pregnant should delay immunization until after pregnancy.  Pneumococcal 13-valent conjugate (PCV13) vaccine.** / Consult your health care provider.  Pneumococcal polysaccharide (PPSV23) vaccine.** / 1 to 2 doses if you smoke cigarettes or if you have certain conditions.  Meningococcal vaccine.** / 1 dose if you are age 19 to 68 years and a Market researcher living in a residence hall, or have one of several medical conditions, you need to get vaccinated against meningococcal disease. You may also need additional booster doses.  Hepatitis A vaccine.** / Consult your health care provider.  Hepatitis B vaccine.** / Consult your health care provider.  Haemophilus influenzae type b (Hib) vaccine.** / Consult your health care provider. Ages 51 to 31 years  Blood pressure check.** / Every year.  Lipid and cholesterol check.** / Every 5 years beginning at age 4 years.  Lung cancer screening. / Every year if you are aged 26-80 years and have a 30-pack-year history of smoking and currently smoke or have quit within the past 15 years. Yearly screening is stopped once you have quit smoking for at least 15 years or develop a health problem that would prevent you from having lung cancer treatment.  Clinical breast exam.** / Every year after age 32 years.  BRCA-related cancer risk assessment.** / For women who have family members with a BRCA-related cancer (breast, ovarian, tubal, or peritoneal cancers).  Mammogram.** / Every year beginning at age 58 years and continuing for as long as you are in good health. Consult with your health care provider.  Pap test.** / Every 3 years starting at age 38 years through age  70 or 45 years with a history of 3 consecutive normal Pap tests.  HPV screening.** / Every 3 years from ages 20 years through ages 55 to 95 years with a history of 3 consecutive normal Pap tests.  Fecal occult blood test (FOBT) of stool. / Every year beginning at age 18 years and continuing until age 67 years. You may not need to do this test if you get a colonoscopy every 10 years.  Flexible sigmoidoscopy or colonoscopy.** / Every 5 years for a flexible sigmoidoscopy or every 10 years for a colonoscopy beginning at age 69 years and continuing until age 2 years.  Hepatitis C blood test.** / For all people born from [birth year] through 1966 and any individual with known risks for hepatitis C.  Skin self-exam. / Monthly.  Influenza vaccine. / Every year.  Tetanus, diphtheria, and acellular pertussis (Tdap/Td) vaccine.** / Consult your health care provider. Pregnant women should receive 1 dose of Tdap vaccine during each pregnancy. 1 dose of Td every 10 years.  Varicella vaccine.** / Consult your health care provider. Pregnant females who do not have evidence of immunity should receive the first dose after pregnancy.  Zoster vaccine.** / 1 dose for adults aged 71 years or older.  Measles, mumps, rubella (MMR) vaccine.** / You need at least 1 dose of MMR if you were born in 1957 or later. You may also need a second dose. For females of childbearing age, rubella immunity should be  determined. If there is no evidence of immunity, females who are not pregnant should be vaccinated. If there is no evidence of immunity, females who are pregnant should delay immunization until after pregnancy.  Pneumococcal 13-valent conjugate (PCV13) vaccine.** / Consult your health care provider.  Pneumococcal polysaccharide (PPSV23) vaccine.** / 1 to 2 doses if you smoke cigarettes or if you have certain conditions.  Meningococcal vaccine.** / Consult your health care provider.  Hepatitis A vaccine.** / Consult your  health care provider.  Hepatitis B vaccine.** / Consult your health care provider.  Haemophilus influenzae type b (Hib) vaccine.** / Consult your health care provider. Ages 82 years and over  Blood pressure check.** / Every year.  Lipid and cholesterol check.** / Every 5 years beginning at age 74 years.  Lung cancer screening. / Every year if you are aged 33-80 years and have a 30-pack-year history of smoking and currently smoke or have quit within the past 15 years. Yearly screening is stopped once you have quit smoking for at least 15 years or develop a health problem that would prevent you from having lung cancer treatment.  Clinical breast exam.** / Every year after age 26 years.  BRCA-related cancer risk assessment.** / For women who have family members with a BRCA-related cancer (breast, ovarian, tubal, or peritoneal cancers).  Mammogram.** / Every year beginning at age 96 years and continuing for as long as you are in good health. Consult with your health care provider.  Pap test.** / Every 3 years starting at age 75 years through age 71 or 43 years with 3 consecutive normal Pap tests. Testing can be stopped between 65 and 70 years with 3 consecutive normal Pap tests and no abnormal Pap or HPV tests in the past 10 years.  HPV screening.** / Every 3 years from ages 50 years through ages 53 or 21 years with a history of 3 consecutive normal Pap tests. Testing can be stopped between 65 and 70 years with 3 consecutive normal Pap tests and no abnormal Pap or HPV tests in the past 10 years.  Fecal occult blood test (FOBT) of stool. / Every year beginning at age 59 years and continuing until age 35 years. You may not need to do this test if you get a colonoscopy every 10 years.  Flexible sigmoidoscopy or colonoscopy.** / Every 5 years for a flexible sigmoidoscopy or every 10 years for a colonoscopy beginning at age 48 years and continuing until age 74 years.  Hepatitis C blood test.** /  For all people born from [birth year] through 1966 and any individual with known risks for hepatitis C.  Osteoporosis screening.** / A one-time screening for women ages 62 years and over and women at risk for fractures or osteoporosis.  Skin self-exam. / Monthly.  Influenza vaccine. / Every year.  Tetanus, diphtheria, and acellular pertussis (Tdap/Td) vaccine.** / 1 dose of Td every 10 years.  Varicella vaccine.** / Consult your health care provider.  Zoster vaccine.** / 1 dose for adults aged 66 years or older.  Pneumococcal 13-valent conjugate (PCV13) vaccine.** / Consult your health care provider.  Pneumococcal polysaccharide (PPSV23) vaccine.** / 1 dose for all adults aged 52 years and older.  Meningococcal vaccine.** / Consult your health care provider.  Hepatitis A vaccine.** / Consult your health care provider.  Hepatitis B vaccine.** / Consult your health care provider.  Haemophilus influenzae type b (Hib) vaccine.** / Consult your health care provider. ** Family history and personal history of risk and  conditions may change your health care provider's recommendations.   This information is not intended to replace advice given to you by your health care provider. Make sure you discuss any questions you have with your health care provider.   Document Released: 11/20/2001 Document Revised: 10/15/2014 Document Reviewed: 02/19/2011 Elsevier Interactive Patient Education [employer].

## 2015-12-26 NOTE — Telephone Encounter (Signed)
Repeat tsh in 3 months.

## 2015-12-26 NOTE — Progress Notes (Signed)
Subjective:    Patient ID: Kelsey Martinez, female    DOB: 1965/12/23, 50 y.o.   MRN: [id number]  HPI Pt in for evaluation. First time. Pt states last seen in 2010. No physical done since then.  Pt does not exercise regularly but estimates exercises 3 times a week, Pt drinks Dr. Malachi Bonds 2 a day(Trying to cut back), Pt attempting to eat healthy, married(6 year grandson lives with her)  Pt just recently had gyn exam 12-16-2015. Also had mammogram done 12-16-2015.(studies exam)  Pt does need coloscopy. Overdue in August 2016. I referred he last week. Will get done on April 18,2017.  Needs hiv screen.  Up to date on tdap.  Pt is fasting.  Pt has history of low vitamin D level in past. They prescribed the vitamin D.  Also pt was put on progesterone by gyn.Only brief 7 days of medicine. Then menstruated. But then stopped.    Review of Systems  Constitutional: Negative for fever, chills, activity change and fatigue.  Respiratory: Negative for cough, chest tightness and shortness of breath.   Cardiovascular: Negative for chest pain, palpitations and leg swelling.  Gastrointestinal: Negative for nausea, vomiting and abdominal pain.  Musculoskeletal: Negative for neck pain and neck stiffness.       Occasional intermittent bilateral shoulder crepitus sounds and pain. Xray of left one done at Urgent care. [place]. She was given exercises today. Off and on pain for one year. No injury or fall. Pain just came on.  Neurological: Negative for dizziness, seizures, facial asymmetry, weakness, numbness and headaches.  Psychiatric/Behavioral: Negative for behavioral problems, confusion and agitation. The patient is not nervous/anxious.     Past Medical History  Diagnosis Date  . Cervical cancer ([place])   . Anemia     Social History   Social History  . Marital Status: Divorced    Spouse Name: N/A  . Number of Children: N/A  . Years of Education: N/A   Occupational History  . Not on file.    Social History Main Topics  . Smoking status: Never Smoker   . Smokeless tobacco: Not on file  . Alcohol Use: No  . Drug Use: No  . Sexual Activity: Yes   Other Topics Concern  . Not on file   Social History Narrative    Past Surgical History  Procedure Laterality Date  . Cervical biopsy  w/ loop electrode excision      this is what pt describes.    Family History  Problem Relation Age of Onset  . Healthy Mother   . Cancer Father     Kidney cancer  . Healthy Sister     No Known Allergies  Current Outpatient Prescriptions on File Prior to Visit  Medication Sig Dispense Refill  . Cholecalciferol (VITAMIN D3) 50000 units CAPS Take 1 capsule by mouth once a week.  0  . ferrous sulfate 325 (65 FE) MG tablet Take 1 tablet (325 mg total) by mouth 2 (two) times daily with a meal. 60 tablet 1   No current facility-administered medications on file prior to visit.    BP 88/60 mmHg  Pulse 66  Temp(Src) 97.9 F (36.6 C) (Oral)  Ht 5\' 1"  (1.549 m)  Wt 176 lb 6.4 oz (80.015 kg)  BMI 33.35 kg/m2  SpO2 98%  LMP 12/16/2013       Objective:   Physical Exam  General Mental Status- Alert. General Appearance- Not in acute distress.   Skin General: Color- Normal Color. Moisture-  Normal Moisture.  Neck Carotid Arteries- Normal color. Moisture- Normal Moisture. No carotid bruits. No JVD.  Chest and Lung Exam Auscultation: Breath Sounds:-Normal.  Cardiovascular Auscultation:Rythm- Regular. Murmurs & Other Heart Sounds:Auscultation of the heart reveals- No Murmurs.  Abdomen Inspection:-Inspeection Normal. Palpation/Percussion:Note:No mass. Palpation and Percussion of the abdomen reveal- Non Tender, Non Distended + BS, no rebound or guarding.   Abdomen Inspection:-Inspection Normal.  Palpation/Perucssion: Palpation and Percussion of the abdomen reveal- Non Tender, No Rebound tenderness, No rigidity(Guarding) and No Palpable abdominal masses.  Liver:-Normal.   Spleen:- Normal.   Skin- scattered moderate sized moles on her back. One left scapula region that itches. About 63mm in size and mild amorphous appearance.   Neurologic Cranial Nerve exam:- CN III-XII intact(No nystagmus), symmetric smile. Strength:- 5/5 equal and symmetric strength both upper and lower extremities.  Breast- deferred since done with gyn.  Pelvic- defered since done with gyn.  Lt shoulder- good rom. Mild tender to palpation over bicep tendon.    Assessment & Plan:  Note regarding her shoulder pain. If worsens in future will offer referal to sports med or orthopedist.

## 2015-12-26 NOTE — Progress Notes (Signed)
Pre visit review using our clinic review tool, if applicable. No additional management support is needed unless otherwise documented below in the visit note. 

## 2015-12-27 ENCOUNTER — Telehealth: Payer: Self-pay | Admitting: Medical

## 2015-12-27 NOTE — Addendum Note (Signed)
Addended by: Tasia Catchings on: 12/27/2015 10:25 AM   Modules accepted: Orders, Medications

## 2015-12-27 NOTE — Telephone Encounter (Signed)
So will you clarify with pt  that she has dx of hypothyroid? If so how much thyroid med/dose she is taking??

## 2015-12-28 NOTE — Telephone Encounter (Signed)
Pt states that she was at endocrinologist on 12/27/15 and they would manage thyroid levels.

## 2016-01-10 ENCOUNTER — Ambulatory Visit (AMBULATORY_SURGERY_CENTER): Payer: Self-pay | Admitting: *Deleted

## 2016-01-10 VITALS — Ht 60.0 in | Wt 175.8 lb

## 2016-01-10 DIAGNOSIS — Z1211 Encounter for screening for malignant neoplasm of colon: Secondary | ICD-10-CM

## 2016-01-10 MED ORDER — SUPREP BOWEL PREP KIT 17.5-3.13-1.6 GM/177ML PO SOLN: 1.0000 | Freq: Once | ORAL | Status: DC

## 2016-01-10 NOTE — Progress Notes (Signed)
Patient denies any allergies to egg or soy products. Patient denies complications with anesthesia/sedation.  Patient denies oxygen use at home and denies diet medications. Emmi instructions for colonoscopy explained but patient denied.     

## 2016-01-24 ENCOUNTER — Telehealth: Payer: Self-pay | Admitting: Medical

## 2016-01-24 ENCOUNTER — Encounter: Payer: Self-pay | Admitting: Gastroenterology

## 2016-01-24 ENCOUNTER — Ambulatory Visit (AMBULATORY_SURGERY_CENTER): Payer: BLUE CROSS/BLUE SHIELD | Admitting: Gastroenterology

## 2016-01-24 VITALS — BP 112/74 | HR 70 | Temp 98.4°F | Resp 12 | Ht 60.0 in | Wt 175.0 lb

## 2016-01-24 DIAGNOSIS — Z1211 Encounter for screening for malignant neoplasm of colon: Secondary | ICD-10-CM

## 2016-01-24 MED ORDER — ONDANSETRON HCL 4 MG PO TABS: 4.0000 mg | ORAL_TABLET | ORAL | Status: DC | PRN

## 2016-01-24 MED ORDER — SODIUM CHLORIDE 0.9 % IV SOLN: 500.0000 mL | INTRAVENOUS | Status: DC

## 2016-01-24 NOTE — Telephone Encounter (Signed)
I read the report. She had poor bowel prep. So looks likes she will have to repeat test.

## 2016-01-24 NOTE — Telephone Encounter (Signed)
I saw colonoscopy report but don't understand why health maintenance states she is still overdue. Will you abstract colonosopy. I think you can see same report??

## 2016-01-24 NOTE — Progress Notes (Signed)
Patient awakening,vss,report to rn 

## 2016-01-24 NOTE — Op Note (Signed)
[facility] Patient Name: Kelsey Martinez Procedure Date: 01/24/2016 8:44 AM MRN: [id number] Endoscopist: Mauri Pole , MD Age: 50 Date of Birth: 08/19/1966 Gender: Female Procedure:                Colonoscopy Indications:              Screening for colorectal malignant neoplasm Medicines:                Monitored Anesthesia Care Procedure:                Pre-Anesthesia Assessment:                           - Prior to the procedure, a History and Physical                            was performed, and patient medications and                            allergies were reviewed. The patient's tolerance of                            previous anesthesia was also reviewed. The risks                            and benefits of the procedure and the sedation                            options and risks were discussed with the patient.                            All questions were answered, and informed consent                            was obtained. Prior Anticoagulants: The patient has                            taken no previous anticoagulant or antiplatelet                            agents. ASA Grade Assessment: I - A normal, healthy                            patient. After reviewing the risks and benefits,                            the patient was deemed in satisfactory condition to                            undergo the procedure.                           After obtaining informed consent, the colonoscope  was passed under direct vision. Throughout the                            procedure, the patient's blood pressure, pulse, and                            oxygen saturations were monitored continuously. The                            Model CF-HQ190L ([id number]) scope was introduced                            through the anus with the intention of advancing to                            the cecum. The scope was advanced to the rectum              before the procedure was aborted. Medications were                            given. The colonoscopy was technically difficult                            and complex due to poor bowel prep with stool                            present. The patient tolerated the procedure well.                            The quality of the bowel preparation was poor. The                            rectum was photographed. Scope In: 8:49:27 AM Scope Out: 8:50:21 AM Total Procedure Duration: 0 hours 0 minutes 54 seconds  Findings:                 The perianal and digital rectal examinations were                            normal.                           The procedure was aborted and retroflexion in the                            rectum was not performed due to poor prep with                            stool in the rectum. Complications:            No immediate complications. Estimated Blood Loss:     Estimated blood loss was minimal. Impression:               - Preparation of the colon was poor.                           -  No specimens collected. Recommendation:           - Patient has a contact number available for                            emergencies. The signs and symptoms of potential                            delayed complications were discussed with the                            patient. Return to normal activities tomorrow.                            Written discharge instructions were provided to the                            patient.                           - Resume previous diet.                           - Continue present medications.                           - Repeat colonoscopy at the next available                            appointment for screening purposes with 2 days prep.                           - Return to GI clinic PRN. Mauri Pole, MD 01/24/2016 9:01:21 AM This report has been signed electronically.

## 2016-01-24 NOTE — Patient Instructions (Addendum)
YOU HAD AN ENDOSCOPIC PROCEDURE TODAY AT [facility]:   Refer to the procedure report that was given to you for any specific questions about what was found during the examination.  If the procedure report does not answer your questions, please call your gastroenterologist to clarify.  If you requested that your care partner not be given the details of your procedure findings, then the procedure report has been included in a sealed envelope for you to review at your convenience later.  YOU SHOULD EXPECT: Some feelings of bloating in the abdomen. Passage of more gas than usual.  Walking can help get rid of the air that was put into your GI tract during the procedure and reduce the bloating. If you had a lower endoscopy (such as a colonoscopy or flexible sigmoidoscopy) you may notice spotting of blood in your stool or on the toilet paper. If you underwent a bowel prep for your procedure, you may not have a normal bowel movement for a few days.  Please Note:  You might notice some irritation and congestion in your nose or some drainage.  This is from the oxygen used during your procedure.  There is no need for concern and it should clear up in a day or so.  SYMPTOMS TO REPORT IMMEDIATELY:   Following lower endoscopy (colonoscopy or flexible sigmoidoscopy):  Excessive amounts of blood in the stool  Significant tenderness or worsening of abdominal pains  Swelling of the abdomen that is new, acute  Fever of 100F or higher  For urgent or emergent issues, a gastroenterologist can be reached at any hour by calling [phone].   DIET: Your first meal following the procedure should be a small meal and then it is ok to progress to your normal diet. Heavy or fried foods are harder to digest and may make you feel nauseous or bloated.  Likewise, meals heavy in dairy and vegetables can increase bloating.  Drink plenty of fluids but you should avoid alcoholic beverages for 24  hours.  ACTIVITY:  You should plan to take it easy for the rest of today and you should NOT DRIVE or use heavy machinery until tomorrow (because of the sedation medicines used during the test).    FOLLOW UP: Our staff will call the number listed on your records the next business day following your procedure to check on you and address any questions or concerns that you may have regarding the information given to you following your procedure. If we do not reach you, we will leave a message.  However, if you are feeling well and you are not experiencing any problems, there is no need to return our call.  We will assume that you have returned to your regular daily activities without incident.  If any biopsies were taken you will be contacted by phone or by letter within the next 1-3 weeks.  Please call us at [phone] if you have not heard about the biopsies in 3 weeks.    SIGNATURES/CONFIDENTIALITY: You and/or your care partner have signed paperwork which will be entered into your electronic medical record.  These signatures attest to the fact that that the information above on your After Visit Summary has been reviewed and is understood.  Full responsibility of the confidentiality of this discharge information lies with you and/or your care-partner.  Will call with appointment date and time for future previsit appointment and scheduled colonoscopy. Zofran tablets ordered (5) to take during prep for future colonoscopy.

## 2016-01-25 ENCOUNTER — Telehealth: Payer: Self-pay | Admitting: Gastroenterology

## 2016-01-25 ENCOUNTER — Telehealth: Payer: Self-pay | Admitting: *Deleted

## 2016-01-25 NOTE — Telephone Encounter (Signed)
She did not get charged for the colonoscopy on 01/24/16 as it was aborted

## 2016-01-25 NOTE — Telephone Encounter (Signed)
  Follow up Call-  Call back number 01/24/2016  Post procedure Call Back phone  # [phone]  Permission to leave phone message Yes     Patient questions:  Do you have a fever, pain , or abdominal swelling? No. Pain Score  0 *  Have you tolerated food without any problems? Yes.    Have you been able to return to your normal activities? Yes.    Do you have any questions about your discharge instructions: Diet   No. Medications  No. Follow up visit  No.  Do you have questions or concerns about your Care? No.  Actions: * If pain score is 4 or above: No action needed, pain <4.

## 2016-01-25 NOTE — Telephone Encounter (Signed)
I will call her back and let her know. Thank you.

## 2016-01-25 NOTE — Telephone Encounter (Signed)
Kelsey Martinez says she will call back sometime next year to Reschedule this procedure.

## 2016-02-07 NOTE — Telephone Encounter (Signed)
Declined to schedule at this time. Wants to wait until next year. A Reminder was entered to reach out to patient next year.

## 2016-02-14 ENCOUNTER — Encounter: Payer: BLUE CROSS/BLUE SHIELD | Admitting: Gastroenterology

## 2016-05-31 ENCOUNTER — Telehealth: Payer: Self-pay | Admitting: Medical

## 2016-05-31 NOTE — Telephone Encounter (Signed)
Will you abstract pt mammogram, dexa scan and her papsmear. I am putting this on your desk. I have signed the records from gyn. After you abstract will you place to be scanned.

## 2016-06-01 ENCOUNTER — Encounter: Payer: Self-pay | Admitting: Medical

## 2016-06-01 NOTE — Telephone Encounter (Signed)
Dates for procedures placed.

## 2016-06-13 ENCOUNTER — Telehealth: Payer: Self-pay | Admitting: Medical

## 2016-06-13 MED ORDER — FERROUS SULFATE 325 (65 FE) MG PO TABS: 325.0000 mg | ORAL_TABLET | Freq: Two times a day (BID) | ORAL | 1 refills | Status: DC

## 2016-06-13 NOTE — Telephone Encounter (Signed)
Please advise 

## 2016-06-13 NOTE — Telephone Encounter (Signed)
I did send in iron for 2 more months. She can continue otc vit d. Then in 4 months follow up with me  and will repeat cbc and vitamin D level.  I saw that they tried to get colonoscopy but stopped due to prep problems.   If in 4 months  If any recurrent anemia would let GI know since in that scenario they might want to do colonscopy sooner rather than later.

## 2016-06-13 NOTE — Telephone Encounter (Signed)
Caller name:  Elliott Relationship to patient: self Can be reached: [phone] Pharmacy: [id number] - [place], [place].  Reason for call: Pt is almost out of iron and not sure if she is supposed to continue. Please send in refill. Pt said she was also told to take OTC D3. She is asking if she should continue this as well.

## 2016-06-14 NOTE — Telephone Encounter (Signed)
Spoke w/ Pt, informed her of PCP recommendations. Follow-up appt scheduled 10/17/2016 at 0900.

## 2016-06-23 ENCOUNTER — Other Ambulatory Visit: Payer: Self-pay | Admitting: Medical

## 2016-09-09 ENCOUNTER — Other Ambulatory Visit: Payer: Self-pay | Admitting: Medical

## 2016-10-17 ENCOUNTER — Encounter: Payer: Self-pay | Admitting: Medical

## 2016-10-17 ENCOUNTER — Ambulatory Visit (INDEPENDENT_AMBULATORY_CARE_PROVIDER_SITE_OTHER): Payer: BLUE CROSS/BLUE SHIELD | Admitting: Medical

## 2016-10-17 VITALS — BP 100/74 | HR 71 | Temp 98.0°F | Resp 16 | Ht 60.0 in | Wt 178.0 lb

## 2016-10-17 DIAGNOSIS — E039 Hypothyroidism, unspecified: Secondary | ICD-10-CM | POA: Diagnosis not present

## 2016-10-17 DIAGNOSIS — E785 Hyperlipidemia, unspecified: Secondary | ICD-10-CM | POA: Diagnosis not present

## 2016-10-17 DIAGNOSIS — E559 Vitamin D deficiency, unspecified: Secondary | ICD-10-CM

## 2016-10-17 DIAGNOSIS — R7989 Other specified abnormal findings of blood chemistry: Secondary | ICD-10-CM

## 2016-10-17 LAB — LIPID PANEL
CHOL/HDL RATIO: 4
Cholesterol: 209 mg/dL — ABNORMAL HIGH (ref 0–200)
HDL: 48.5 mg/dL (ref >39.00)
LDL CALC: 138 mg/dL — AB (ref 0–99)
NONHDL: 160.42
TRIGLYCERIDES: 112 mg/dL (ref 0.0–149.0)
VLDL: 22.4 mg/dL (ref 0.0–40.0)

## 2016-10-17 LAB — COMPREHENSIVE METABOLIC PANEL
ALK PHOS: 72 U/L (ref 39–117)
ALT: 35 U/L (ref 0–35)
AST: 26 U/L (ref 0–37)
Albumin: 3.9 g/dL (ref 3.5–5.2)
BILIRUBIN TOTAL: 0.4 mg/dL (ref 0.2–1.2)
BUN: 11 mg/dL (ref 6–23)
CALCIUM: 9 mg/dL (ref 8.4–10.5)
CO2: 25 meq/L (ref 19–32)
Chloride: 108 mEq/L (ref 96–112)
Creatinine, Ser: 0.67 mg/dL (ref 0.40–1.20)
GFR: 98.46 mL/min (ref >60.00)
GLUCOSE: 76 mg/dL (ref 70–99)
POTASSIUM: 3.8 meq/L (ref 3.5–5.1)
Sodium: 141 mEq/L (ref 135–145)
Total Protein: 6.7 g/dL (ref 6.0–8.3)

## 2016-10-17 LAB — TSH: TSH: 3.48 u[IU]/mL (ref 0.35–4.50)

## 2016-10-17 LAB — T4, FREE: FREE T4: 0.75 ng/dL (ref 0.60–1.60)

## 2016-10-17 LAB — VITAMIN D 25 HYDROXY (VIT D DEFICIENCY, FRACTURES): VITD: 39.2 ng/mL (ref 30.00–100.00)

## 2016-10-17 MED ORDER — FLUTICASONE PROPIONATE 50 MCG/ACT NA SUSP: 2.0000 | Freq: Every day | NASAL | 1 refills | Status: DC

## 2016-10-17 MED ORDER — AMOXICILLIN-POT CLAVULANATE 875-125 MG PO TABS: 1.0000 | ORAL_TABLET | Freq: Two times a day (BID) | ORAL | 0 refills | Status: DC

## 2016-10-17 NOTE — Progress Notes (Signed)
Subjective:    Patient ID: Kelsey Martinez, female    DOB: [birth year], 51 y.o.   MRN: [id number]  HPI  Pt in with som sinus  pressure recently worsening over past year. She can blow out yellow green mucous for one year(worse past month). She states about 20 years ago onset of sinus pressure after nose fracture and after wisdom teeth surgery 20 years ago. Pt remembers years ago  Xray imaging revealed sinus inflammation and infection.(done by dentist). Pt states rt nares/nostril feels like has poor air floor.   LMP- post menopausal.   Pt is fasting. She has high cholesterol and low thyroid. On thyroid med. Not on any lipid med.  Also hx of low vitamin D.   Review of Systems  Constitutional: Negative for chills, fatigue and fever.  HENT: Positive for congestion, sinus pain and sinus pressure. Negative for postnasal drip and sore throat.   Respiratory: Negative for cough, chest tightness, shortness of breath and wheezing.   Cardiovascular: Negative for palpitations.  Gastrointestinal: Negative for abdominal pain.  Musculoskeletal: Negative for back pain.  Neurological: Negative for dizziness, syncope, light-headedness and headaches.  Hematological: Negative for adenopathy. Does not bruise/bleed easily.  Psychiatric/Behavioral: Negative for behavioral problems and confusion.   Past Medical History:  Diagnosis Date  . Anemia   . Cervical cancer ([place])   . SVD (spontaneous vaginal delivery)    x 1  . Thyroid disease      Social History   Social History  . Marital status: Divorced    Spouse name: N/A  . Number of children: N/A  . Years of education: N/A   Occupational History  . Not on file.   Social History Main Topics  . Smoking status: Never Smoker  . Smokeless tobacco: Never Used  . Alcohol use No  . Drug use: No  . Sexual activity: Yes    Birth control/ protection: Post-menopausal   Other Topics Concern  . Not on file   Social History Narrative  . No narrative on file     Past Surgical History:  Procedure Laterality Date  . CERVICAL BIOPSY  W/ LOOP ELECTRODE EXCISION     this is what pt describes.  . WISDOM TOOTH EXTRACTION      Family History  Problem Relation Age of Onset  . Healthy Mother   . Cancer Father     Kidney cancer  . Kidney disease Father 105    cancer  . Healthy Sister   . Colon cancer Neg Hx   . Colon polyps Neg Hx   . Esophageal cancer Neg Hx   . Stomach cancer Neg Hx   . Rectal cancer Neg Hx     No Known Allergies  Current Outpatient Prescriptions on File Prior to Visit  Medication Sig Dispense Refill  . ferrous sulfate 325 (65 FE) MG tablet Take 1 tablet (325 mg total) by mouth 2 (two) times daily with a meal. (Patient taking differently: Take 650 mg by mouth daily with supper. ) 60 tablet 1  . levothyroxine (SYNTHROID, LEVOTHROID) 50 MCG tablet Take 50 mcg by mouth daily before breakfast.     No current facility-administered medications on file prior to visit.     BP 100/74 (BP Location: Left Arm, Patient Position: Sitting, Cuff Size: Large)   Pulse 71   Temp 98 F (36.7 C) (Oral)   Resp 16   Ht 5' (1.524 m)   Wt 178 lb (80.7 kg)   LMP 12/16/2013  SpO2 100%   BMI 34.76 kg/m       Objective:   Physical Exam  General  Mental Status - Alert. General Appearance - Well groomed. Not in acute distress.  Skin Rashes- No Rashes.  HEENT Head- Normal. Ear Auditory Canal - Left- Normal. Right - Normal.Tympanic Membrane- Left- Normal. Right- moderate dull Eye Sclera/Conjunctiva- Left- Normal. Right- Normal. Nose & Sinuses Nasal Mucosa- Left-  Boggy and Congested. Right-  Boggy and  Congested.Mild-moderate rt maxillary sinus pressure but  frontal sinus pressure.(deviated septum to the rt side on exam) Mouth & Throat Lips: Upper Lip- Normal: no dryness, cracking, pallor, cyanosis, or vesicular eruption. Lower Lip-Normal: no dryness, cracking, pallor, cyanosis or vesicular eruption. Buccal Mucosa- Bilateral- No  Aphthous ulcers. Oropharynx- No Discharge or Erythema. Tonsils: Characteristics- Bilateral- No Erythema or Congestion. Size/Enlargement- Bilateral- No enlargement. Discharge- bilateral-None.  Neck Neck- Supple. No Masses.   Chest and Lung Exam Auscultation: Breath Sounds:-Clear even and unlabored.  Cardiovascular Auscultation:Rythm- Regular, rate and rhythm. Murmurs & Other Heart Sounds:Ausculatation of the heart reveal- No Murmurs.  Lymphatic Head & Neck General Head & Neck Lymphatics: Bilateral: Description- No Localized lymphadenopathy.       Assessment & Plan:  You appear to have a sinus infection. I am prescribing antibiotic for the infection. To help with the nasal congestion I prescribed nasal steroid.    Based on trauma/fracture hx and deviated septum might need to refer to ENT. Will see how you do.   Rest, hydrate, tylenol for fever.  For hypothyroid, hyperlipidemia, and low  vitamin D will recheck labs  Follow up in 7 days or as needed.  Kelsey Martinez, Percell Miller, PA-C

## 2016-10-17 NOTE — Patient Instructions (Addendum)
You appear to have a sinus infection. I am prescribing antibiotic for the infection. To help with the nasal congestion I prescribed nasal steroid.   Based on trauma/fracture hx and deviated septum might need to refer to ENT. Will see how you do.  Rest, hydrate, tylenol for fever.  For hypothyroid, hyperlipidemia, and low vitamin D history will recheck labs.  Follow up in 7 days or as needed.

## 2016-10-17 NOTE — Progress Notes (Signed)
Pre visit review using our clinic review tool, if applicable. No additional management support is needed unless otherwise documented below in the visit note/SLS  

## 2016-10-22 ENCOUNTER — Other Ambulatory Visit: Payer: Self-pay | Admitting: *Deleted

## 2016-10-22 DIAGNOSIS — E785 Hyperlipidemia, unspecified: Secondary | ICD-10-CM

## 2016-10-22 MED ORDER — ATORVASTATIN CALCIUM 10 MG PO TABS: 10.0000 mg | ORAL_TABLET | Freq: Every day | ORAL | 3 refills | Status: DC

## 2016-10-29 ENCOUNTER — Telehealth: Payer: Self-pay | Admitting: *Deleted

## 2016-10-29 NOTE — Telephone Encounter (Signed)
Pt notified that she can stop iron and will check CBC in 3 months. Pt verbalized understanding.

## 2016-11-14 ENCOUNTER — Other Ambulatory Visit: Payer: Self-pay | Admitting: Medical

## 2016-11-14 DIAGNOSIS — Z1231 Encounter for screening mammogram for malignant neoplasm of breast: Secondary | ICD-10-CM

## 2016-11-19 ENCOUNTER — Encounter: Payer: Self-pay | Admitting: Gastroenterology

## 2016-12-17 ENCOUNTER — Ambulatory Visit: Payer: BLUE CROSS/BLUE SHIELD

## 2017-01-21 ENCOUNTER — Telehealth: Payer: Self-pay | Admitting: Medical

## 2017-01-21 ENCOUNTER — Other Ambulatory Visit (INDEPENDENT_AMBULATORY_CARE_PROVIDER_SITE_OTHER): Payer: BLUE CROSS/BLUE SHIELD

## 2017-01-21 DIAGNOSIS — E785 Hyperlipidemia, unspecified: Secondary | ICD-10-CM | POA: Diagnosis not present

## 2017-01-21 LAB — LIPID PANEL
CHOLESTEROL: 140 mg/dL (ref 0–200)
HDL: 40.8 mg/dL (ref >39.00)
LDL Cholesterol: 82 mg/dL (ref 0–99)
NonHDL: 99.31
TRIGLYCERIDES: 86 mg/dL (ref 0.0–149.0)
Total CHOL/HDL Ratio: 3
VLDL: 17.2 mg/dL (ref 0.0–40.0)

## 2017-01-21 MED ORDER — ATORVASTATIN CALCIUM 10 MG PO TABS: 10.0000 mg | ORAL_TABLET | Freq: Every day | ORAL | 3 refills | Status: DC

## 2017-01-21 MED ORDER — LEVOTHYROXINE SODIUM 75 MCG PO TABS: 75.0000 ug | ORAL_TABLET | Freq: Every day | ORAL | 0 refills | Status: DC

## 2017-01-21 NOTE — Telephone Encounter (Signed)
Relation to GN:FAOZ Call back number:[phone]   Reason for call:  Patient states the following medications are not current medications:  amoxicillin-clavulanate (AUGMENTIN) 875-125 MG tablet  ferrous sulfate 325 (65 FE) MG tablet  Cholecalciferol (VITAMIN D3) 2000 units capsule  levothyroxine (SYNTHROID, LEVOTHROID) 50 MCG tablet and states MCG has been increased to 75

## 2017-01-21 NOTE — Telephone Encounter (Signed)
Notify pt that I sent in new rx lipitor to pharmacy.

## 2017-01-22 NOTE — Telephone Encounter (Signed)
Called and left message on pt's vm, provider sent in new Rx Lipitor. LB

## 2017-06-02 ENCOUNTER — Other Ambulatory Visit: Payer: Self-pay | Admitting: Medical

## 2017-06-03 NOTE — Telephone Encounter (Signed)
Pt due for follow up please call and schedule appointment.  

## 2017-06-04 NOTE — Telephone Encounter (Signed)
Patient scheduled for 07/02/2017 with PCP

## 2017-06-30 ENCOUNTER — Other Ambulatory Visit: Payer: Self-pay | Admitting: Medical

## 2017-07-01 NOTE — Telephone Encounter (Signed)
Pt due for follow up please call and schedule.

## 2017-07-02 ENCOUNTER — Ambulatory Visit (INDEPENDENT_AMBULATORY_CARE_PROVIDER_SITE_OTHER): Payer: BLUE CROSS/BLUE SHIELD | Admitting: Medical

## 2017-07-02 ENCOUNTER — Encounter: Payer: Self-pay | Admitting: Medical

## 2017-07-02 VITALS — BP 104/76 | HR 91 | Temp 98.1°F | Ht 61.0 in | Wt 181.4 lb

## 2017-07-02 DIAGNOSIS — J3089 Other allergic rhinitis: Secondary | ICD-10-CM

## 2017-07-02 DIAGNOSIS — R5383 Other fatigue: Secondary | ICD-10-CM

## 2017-07-02 DIAGNOSIS — E785 Hyperlipidemia, unspecified: Secondary | ICD-10-CM

## 2017-07-02 DIAGNOSIS — E039 Hypothyroidism, unspecified: Secondary | ICD-10-CM

## 2017-07-02 LAB — COMPREHENSIVE METABOLIC PANEL
ALT: 66 U/L — ABNORMAL HIGH (ref 0–35)
AST: 58 U/L — AB (ref 0–37)
Albumin: 3.9 g/dL (ref 3.5–5.2)
Alkaline Phosphatase: 93 U/L (ref 39–117)
BUN: 11 mg/dL (ref 6–23)
CALCIUM: 9.1 mg/dL (ref 8.4–10.5)
CHLORIDE: 107 meq/L (ref 96–112)
CO2: 29 meq/L (ref 19–32)
CREATININE: 0.71 mg/dL (ref 0.40–1.20)
GFR: 91.83 mL/min (ref >60.00)
Glucose, Bld: 90 mg/dL (ref 70–99)
Potassium: 4.2 mEq/L (ref 3.5–5.1)
SODIUM: 142 meq/L (ref 135–145)
Total Bilirubin: 0.5 mg/dL (ref 0.2–1.2)
Total Protein: 6.4 g/dL (ref 6.0–8.3)

## 2017-07-02 LAB — CBC WITH DIFFERENTIAL/PLATELET
BASOS PCT: 0.8 % (ref 0.0–3.0)
Basophils Absolute: 0 10*3/uL (ref 0.0–0.1)
Eosinophils Absolute: 0.1 10*3/uL (ref 0.0–0.7)
Eosinophils Relative: 1.9 % (ref 0.0–5.0)
HCT: 44.4 % (ref 36.0–46.0)
Hemoglobin: 14.7 g/dL (ref 12.0–15.0)
LYMPHS ABS: 1.9 10*3/uL (ref 0.7–4.0)
Lymphocytes Relative: 34.5 % (ref 12.0–46.0)
MCHC: 33 g/dL (ref 30.0–36.0)
MCV: 94.3 fl (ref 78.0–100.0)
MONO ABS: 0.4 10*3/uL (ref 0.1–1.0)
Monocytes Relative: 7.4 % (ref 3.0–12.0)
NEUTROS ABS: 3.1 10*3/uL (ref 1.4–7.7)
NEUTROS PCT: 55.4 % (ref 43.0–77.0)
PLATELETS: 221 10*3/uL (ref 150.0–400.0)
RBC: 4.71 Mil/uL (ref 3.87–5.11)
RDW: 13.4 % (ref 11.5–15.5)
WBC: 5.6 10*3/uL (ref 4.0–10.5)

## 2017-07-02 LAB — LIPID PANEL
CHOL/HDL RATIO: 3
Cholesterol: 143 mg/dL (ref 0–200)
HDL: 45.5 mg/dL (ref >39.00)
LDL CALC: 78 mg/dL (ref 0–99)
NonHDL: 97.03
TRIGLYCERIDES: 93 mg/dL (ref 0.0–149.0)
VLDL: 18.6 mg/dL (ref 0.0–40.0)

## 2017-07-02 LAB — TSH: TSH: 2.92 u[IU]/mL (ref 0.35–4.50)

## 2017-07-02 MED ORDER — FLUTICASONE PROPIONATE 50 MCG/ACT NA SUSP: 2.0000 | Freq: Every day | NASAL | 2 refills | Status: DC

## 2017-07-02 MED ORDER — LEVOCETIRIZINE DIHYDROCHLORIDE 5 MG PO TABS: 5.0000 mg | ORAL_TABLET | Freq: Every evening | ORAL | 3 refills | Status: DC

## 2017-07-02 NOTE — Patient Instructions (Addendum)
Presently he does appear that you have allergic rhinitis. I will prescribe Xyzal antihistamine and Flonase nasal spray. You Xyzal at night and Flonase in the morning. If your symptoms worsen such as sinus pressure, productive cough or colored nasal drainage please let me know as you might need an antibiotic in that event.  For your history of high cholesterol, hypothyroidism and recent mild fatigue, I put in orders for CBC, CMP and TSH.  Follow-up date to be determined after lab review.  After you get over current symptoms would also recommend that you get a flu vaccine and you could schedule that as a nurse visit.

## 2017-07-02 NOTE — Telephone Encounter (Signed)
Patient being seen today

## 2017-07-02 NOTE — Progress Notes (Signed)
Subjective:    Patient ID: Kelsey Martinez, female    DOB: [birth year], 51 y.o.   MRN: [id number]  HPI  Pt in states feeling congested since Friday. Pt states nasal congestion and a lot of pnd. No colored mucous. Clear drainage. Some occasional sneeze. No fever, no chills or sweats. Pt has some history of allergic rhinitis.    Pt has history of high cholesterol. Pt states riding exercise bike at least every other day. Pt eats fairly healthy. Pt on statin.   Pt in past had high tsh. On Thyroid medication.  Review of Systems  Constitutional: Positive for fatigue. Negative for chills and fever.       Mild with symptoms.  HENT: Positive for congestion, rhinorrhea and sneezing. Negative for ear pain, nosebleeds, postnasal drip, sinus pain, sinus pressure and sore throat.   Respiratory: Negative for cough, chest tightness and wheezing.   Cardiovascular: Negative for chest pain and palpitations.  Gastrointestinal: Negative for abdominal pain.  Genitourinary: Negative for difficulty urinating, dyspareunia and dysuria.  Musculoskeletal: Negative for back pain, gait problem, myalgias and neck stiffness.  Skin: Negative for rash.  Neurological: Negative for dizziness, seizures, syncope, weakness and headaches.  Hematological: Negative for adenopathy. Does not bruise/bleed easily.  Psychiatric/Behavioral: Negative for behavioral problems, confusion, hallucinations, self-injury and suicidal ideas. The patient is not nervous/anxious.    Past Medical History:  Diagnosis Date  . Anemia   . Cervical cancer ([place])   . SVD (spontaneous vaginal delivery)    x 1  . Thyroid disease      Social History   Social History  . Marital status: Divorced    Spouse name: N/A  . Number of children: N/A  . Years of education: N/A   Occupational History  . Not on file.   Social History Main Topics  . Smoking status: Never Smoker  . Smokeless tobacco: Never Used  . Alcohol use No  . Drug use: No  . Sexual  activity: Yes    Birth control/ protection: Post-menopausal   Other Topics Concern  . Not on file   Social History Narrative  . No narrative on file    Past Surgical History:  Procedure Laterality Date  . CERVICAL BIOPSY  W/ LOOP ELECTRODE EXCISION     this is what pt describes.  . WISDOM TOOTH EXTRACTION      Family History  Problem Relation Age of Onset  . Healthy Mother   . Cancer Father        Kidney cancer  . Kidney disease Father 45       cancer  . Healthy Sister   . Colon cancer Neg Hx   . Colon polyps Neg Hx   . Esophageal cancer Neg Hx   . Stomach cancer Neg Hx   . Rectal cancer Neg Hx     No Known Allergies  Current Outpatient Prescriptions on File Prior to Visit  Medication Sig Dispense Refill  . atorvastatin (LIPITOR) 10 MG tablet TAKE 1 TABLET BY MOUTH EVERY DAY 30 tablet 0  . levothyroxine (SYNTHROID, LEVOTHROID) 75 MCG tablet Take 1 tablet (75 mcg total) by mouth daily. 30 tablet 0   No current facility-administered medications on file prior to visit.     BP 104/76 (BP Location: Left Arm, Patient Position: Sitting, Cuff Size: Normal)   Pulse 91   Temp 98.1 F (36.7 C) (Oral)   Ht 5\' 1"  (1.549 m)   Wt 181 lb 6.4 oz (82.3 kg)  LMP 12/16/2013   SpO2 97%   BMI 34.28 kg/m       Objective:   Physical Exam   General  Mental Status - Alert. General Appearance - Well groomed. Not in acute distress.  Skin Rashes- No Rashes.  HEENT Head- Normal. Ear Auditory Canal - Left- Normal. Right - Normal.Tympanic Membrane- Left- Normal. Right- Normal. Eye Sclera/Conjunctiva- Left- Normal. Right- Normal. Nose & Sinuses Nasal Mucosa- Left-  Boggy and Congested. Right-  Boggy and  Congested.Bilateral  No maxillary and  no frontal sinus pressure. Mouth & Throat Lips: Upper Lip- Normal: no dryness, cracking, pallor, cyanosis, or vesicular eruption. Lower Lip-Normal: no dryness, cracking, pallor, cyanosis or vesicular eruption. Buccal Mucosa- Bilateral-  No Aphthous ulcers. Oropharynx- No Discharge or Erythema. Positive postnasal drainage Tonsils: Characteristics- Bilateral- No Erythema or Congestion. Size/Enlargement- Bilateral- No enlargement. Discharge- bilateral-None.  Neck Neck- Supple. No Masses.   Chest and Lung Exam Auscultation: Breath Sounds:-Clear even and unlabored.  Cardiovascular Auscultation:Rythm- Regular, rate and rhythm. Murmurs & Other Heart Sounds:Ausculatation of the heart reveal- No Murmurs.  Lymphatic Head & Neck General Head & Neck Lymphatics: Bilateral: Description- No Localized lymphadenopathy.     Assessment & Plan:  Presently he does appear that you have allergic rhinitis. I will prescribe Xyzal antihistamine and Flonase nasal spray. You Xyzal at night and Flonase in the morning. If your symptoms worsen such as sinus pressure, productive cough or colored nasal drainage please let me know as you might need an antibiotic in that event.  For your history of high cholesterol, hypothyroidism and recent mild fatigue, I put in orders for CBC, CMP and TSH.  Follow-up date to be determined after lab review.  After you get over current symptoms would also recommend that you get a flu vaccine and you could schedule that as a nurse visit.  Tyaira Heward, Percell Miller, PA-C

## 2017-07-03 ENCOUNTER — Telehealth: Payer: Self-pay | Admitting: Medical

## 2017-07-03 DIAGNOSIS — R748 Abnormal levels of other serum enzymes: Secondary | ICD-10-CM

## 2017-07-03 NOTE — Telephone Encounter (Signed)
Would you let patient know that I went ahead and put in the ultrasound of the abdomen order in. That can be scheduled and they will advise her on doing the test fasting. I think she has to passively 6 hours prior to the test. Then about a week I want her to come in and get lab work. We'll repeat the liver enzymes and see if they are stable or if increasing? I don't need to see her the day that she gets the labs which is asked that she go ahead and schedule that.

## 2017-07-04 ENCOUNTER — Telehealth: Payer: Self-pay | Admitting: Medical

## 2017-07-04 NOTE — Telephone Encounter (Signed)
Pt aware/scheduled for lab visit on 10.04.18 @ 8:15am/thx dmf

## 2017-07-04 NOTE — Telephone Encounter (Signed)
Pt wants to know why imaging told her it was urgent that she come today instead of tomorrow for US abdomen. Please call pt [phone].

## 2017-07-04 NOTE — Telephone Encounter (Signed)
°  Relation to pt: self Call back number: [phone]   Reason for call:  Patient in need of clarity regarding  US Abdomen Complete orders patient states imaging reached out to her to schedule appointment and she doesn't know why, please advise

## 2017-07-04 NOTE — Telephone Encounter (Signed)
Spoke wity pt. Pt voiced understanding

## 2017-07-04 NOTE — Telephone Encounter (Signed)
Left pt a message to call back. 

## 2017-07-05 ENCOUNTER — Ambulatory Visit (HOSPITAL_BASED_OUTPATIENT_CLINIC_OR_DEPARTMENT_OTHER): Admission: RE | Admit: 2017-07-05 | Payer: BLUE CROSS/BLUE SHIELD | Source: Ambulatory Visit

## 2017-07-11 ENCOUNTER — Ambulatory Visit (INDEPENDENT_AMBULATORY_CARE_PROVIDER_SITE_OTHER): Payer: BLUE CROSS/BLUE SHIELD | Admitting: Medical

## 2017-07-11 ENCOUNTER — Encounter: Payer: Self-pay | Admitting: Medical

## 2017-07-11 ENCOUNTER — Other Ambulatory Visit: Payer: BLUE CROSS/BLUE SHIELD

## 2017-07-11 VITALS — BP 103/68 | HR 61 | Temp 97.7°F | Resp 16 | Ht 61.0 in | Wt 183.0 lb

## 2017-07-11 DIAGNOSIS — L089 Local infection of the skin and subcutaneous tissue, unspecified: Secondary | ICD-10-CM | POA: Diagnosis not present

## 2017-07-11 DIAGNOSIS — R748 Abnormal levels of other serum enzymes: Secondary | ICD-10-CM | POA: Diagnosis not present

## 2017-07-11 LAB — COMPREHENSIVE METABOLIC PANEL
ALK PHOS: 63 U/L (ref 39–117)
ALT: 27 U/L (ref 0–35)
AST: 16 U/L (ref 0–37)
Albumin: 3.5 g/dL (ref 3.5–5.2)
BUN: 13 mg/dL (ref 6–23)
CALCIUM: 8.8 mg/dL (ref 8.4–10.5)
CO2: 28 meq/L (ref 19–32)
Chloride: 108 mEq/L (ref 96–112)
Creatinine, Ser: 0.74 mg/dL (ref 0.40–1.20)
GFR: 87.54 mL/min (ref >60.00)
GLUCOSE: 88 mg/dL (ref 70–99)
POTASSIUM: 4.2 meq/L (ref 3.5–5.1)
Sodium: 140 mEq/L (ref 135–145)
Total Bilirubin: 0.4 mg/dL (ref 0.2–1.2)
Total Protein: 5.6 g/dL — ABNORMAL LOW (ref 6.0–8.3)

## 2017-07-11 MED ORDER — DOXYCYCLINE HYCLATE 100 MG PO TABS: 100.0000 mg | ORAL_TABLET | Freq: Two times a day (BID) | ORAL | 0 refills | Status: DC

## 2017-07-11 NOTE — Progress Notes (Signed)
   Subjective:    Patient ID: Kelsey Martinez, female    DOB: 08/12/1966, 51 y.o.   MRN: [id number]  HPI  Pt had raised tender area on her left great toe about 2 weeks ago. Pt husband squeezed area and clear drainage came out. No yellow dc at that time. Pt thought area would improve but swelling never went down and still tender. Pt has some hair that grows in area and she shaves toe.  Also some nasal congestion. I rx'd allergy meds.   Review of Systems  Constitutional: Negative for chills, fatigue and fever.  HENT: Positive for congestion.   Respiratory: Negative for cough, chest tightness, shortness of breath and wheezing.   Cardiovascular: Negative for chest pain and palpitations.  Musculoskeletal: Negative for back pain.  Skin:       See hpi and exam.  Neurological: Negative for dizziness and light-headedness.  Hematological: Negative for adenopathy. Does not bruise/bleed easily.  Psychiatric/Behavioral: Negative for behavioral problems and confusion.       Objective:   Physical Exam  General  Mental Status - Alert. General Appearance - Well groomed. Not in acute distress.  Skin Rashes- No Rashes.  HEENT Head- Normal. Ear Auditory Canal - Left- Normal. Right - Normal.Tympanic Membrane- Left- Normal. Right- Normal. Eye Sclera/Conjunctiva- Left- Normal. Right- Normal. Nose & Sinuses Nasal Mucosa- Left-  Boggy and Congested. Right-  Boggy and  Congested.Bilateral  No maxillary and no  frontal sinus pressure. Mouth & Throat Lips: Upper Lip- Normal: no dryness, cracking, pallor, cyanosis, or vesicular eruption. Lower Lip-Normal: no dryness, cracking, pallor, cyanosis or vesicular eruption. Buccal Mucosa- Bilateral- No Aphthous ulcers. Oropharynx- No Discharge or Erythema. Tonsils: Characteristics- Bilateral- No Erythema or Congestion. Size/Enlargement- Bilateral- No enlargement. Discharge- bilateral-None.  Neck Neck- Supple. No Masses.   Chest and Lung  Exam Auscultation: Breath Sounds:-Clear even and unlabored.  Cardiovascular Auscultation:Rythm- Regular, rate and rhythm. Murmurs & Other Heart Sounds:Ausculatation of the heart reveal- No Murmurs.  Lymphatic Head & Neck General Head & Neck Lymphatics: Bilateral: Description- No Localized lymphadenopathy.  Skin- left great, base of toe inflamed mid red and tender area. Induration but no fluctuance.       Assessment & Plan:  For skin infection base of left great toe rx doxycycline and can do warm salt water soaks twice daily as well.  Area should gradually improve if not or if worsens notify us.  Continue recent allergy medication treatment.  Follow up 7-10 days or as needed  Daneya Hartgrove, Percell Miller, [employer]

## 2017-07-11 NOTE — Patient Instructions (Signed)
For skin infection base of left great toe rx doxycycline and can do warm salt water soaks twice daily as well.  Area should gradually improve if not or if worsens notify us.  Continue recent allergy medication treatment.  Follow up 7-10 days or as needed

## 2017-07-12 ENCOUNTER — Telehealth: Payer: Self-pay | Admitting: Medical

## 2017-07-12 ENCOUNTER — Other Ambulatory Visit: Payer: Self-pay

## 2017-07-12 MED ORDER — DOXYCYCLINE HYCLATE 100 MG PO TABS: 100.0000 mg | ORAL_TABLET | Freq: Two times a day (BID) | ORAL | 0 refills | Status: DC

## 2017-07-12 NOTE — Telephone Encounter (Signed)
Pt called in to follow up on request. She said that Rx for doxycycline should have been sent to:    Pharmacy: [id number] - [place], [place].

## 2017-07-12 NOTE — Telephone Encounter (Signed)
Rx sent to pharmacy   

## 2017-09-10 ENCOUNTER — Other Ambulatory Visit: Payer: Self-pay | Admitting: Medical

## 2017-09-14 ENCOUNTER — Other Ambulatory Visit: Payer: Self-pay | Admitting: Medical

## 2017-10-09 ENCOUNTER — Other Ambulatory Visit: Payer: Self-pay | Admitting: Medical

## 2017-10-18 MED FILL — ATORVASTATIN 10 MG TABLET: 10 | 30 days supply | Qty: 30 | Fill #0

## 2017-10-31 MED FILL — LEVOTHYROXINE 75 MCG TABLET: 75 | 30 days supply | Qty: 30 | Fill #0

## 2017-11-15 ENCOUNTER — Other Ambulatory Visit: Payer: Self-pay | Admitting: Medical

## 2017-11-15 MED FILL — ATORVASTATIN 10 MG TABLET: 10 | 30 days supply | Qty: 30 | Fill #0

## 2017-12-26 ENCOUNTER — Other Ambulatory Visit: Payer: Self-pay | Admitting: Medical

## 2017-12-26 MED FILL — LEVOTHYROXINE 75 MCG TABLET: 75 | 30 days supply | Qty: 30 | Fill #1

## 2017-12-30 DIAGNOSIS — Z01419 Encounter for gynecological examination (general) (routine) without abnormal findings: Secondary | ICD-10-CM | POA: Diagnosis not present

## 2017-12-31 MED FILL — ATORVASTATIN 10 MG TABLET: 10 | 30 days supply | Qty: 30 | Fill #0

## 2017-12-31 NOTE — Telephone Encounter (Signed)
Refill sent. Mychart message sent to pt regarding completion.

## 2017-12-31 NOTE — Telephone Encounter (Signed)
Relation to pt: Umholtz,Allen / spouse  Call back [place]: [place], [place] - [place] [phone] (Phone) [phone] (Fax)     Reason for call:   Patient checking on the status of atorvastatin (LIPITOR) 10 MG tablet refill, pharmacy sent in request on 12/26/17, pease advise

## 2018-01-02 ENCOUNTER — Ambulatory Visit: Payer: 59 | Admitting: Medical

## 2018-01-02 ENCOUNTER — Encounter: Payer: Self-pay | Admitting: Medical

## 2018-01-02 VITALS — BP 110/62 | HR 89 | Resp 16 | Ht 61.0 in | Wt 183.8 lb

## 2018-01-02 DIAGNOSIS — M25511 Pain in right shoulder: Secondary | ICD-10-CM | POA: Diagnosis not present

## 2018-01-02 DIAGNOSIS — G8929 Other chronic pain: Secondary | ICD-10-CM

## 2018-01-02 DIAGNOSIS — B029 Zoster without complications: Secondary | ICD-10-CM

## 2018-01-02 MED ORDER — DICLOFENAC SODIUM 75 MG PO TBEC: 75.0000 mg | DELAYED_RELEASE_TABLET | Freq: Two times a day (BID) | ORAL | 0 refills | Status: DC

## 2018-01-02 MED ORDER — TRAMADOL HCL 50 MG PO TABS: ORAL_TABLET | ORAL | 0 refills | Status: DC

## 2018-01-02 MED ORDER — FAMCICLOVIR 500 MG PO TABS: 500.0000 mg | ORAL_TABLET | Freq: Three times a day (TID) | ORAL | 0 refills | Status: DC

## 2018-01-02 MED FILL — traMADol HCL 50 MG TABS: 50 | 5 days supply | Qty: 15 | Fill #0

## 2018-01-02 NOTE — Patient Instructions (Signed)
For your history of chronic right shoulder pain and decreased range of motion, I do have concern that you might have suffered rotator cuff injury in the past.  For moderate pain and inflammation I am prescribing diclofenac.  I went ahead and referred you to [facility] orthopedist.  After discussion we decided not to get x-ray.  Today as they will probably do your own x-ray.  In the and they might even possibly do MRI.  Your left side back pain with dermatomal pattern of pain towards the left rib region may represent early shingles outbreak.  Only one faint area of rash present today.  I do think based on your neuropathic description of pain that it would be best to go ahead and treat with Famvir and make tramadol available for more severe pain.  Follow-up in 7 days to evaluate the skin area.  If you do get blister type eruption in the area of pain please give [place] an update by phone or send my chart message.

## 2018-01-02 NOTE — Progress Notes (Signed)
Subjective:    Patient ID: Kelsey Martinez, female    DOB: 09-Dec-1965, 52 y.o.   MRN: [id number]  HPI  Pt in with some rt shoulder pain. Pain has been for one year. Pt states feels/hear clicking sounds. Has hard time abducting her shoulder. No fall or injury. Pt has been to massage therapist and states it does help little bit but not for long.   Also has some thoracic area back pain that is radiating toward to left lower rib area.Pt states pain started this weekend. Patient states pain on and off but most of time pain present and sharp. Brief area of relief. Feels like a burning sensation. Pt states faint rash in area but always faint rash. This is not new.   Review of Systems  Constitutional: Negative for chills, fatigue and fever.  Respiratory: Negative for cough, chest tightness, shortness of breath and wheezing.   Cardiovascular: Negative for chest pain and palpitations.  Gastrointestinal: Negative for abdominal pain.  Musculoskeletal: Positive for back pain. Negative for gait problem, myalgias and neck pain.       Rt shoulder pain.  Thoracic pain. Some radicular pain to left lower rib.  Neurological: Negative for dizziness, speech difficulty, weakness, light-headedness and headaches.  Hematological: Negative for adenopathy. Does not bruise/bleed easily.  Psychiatric/Behavioral: Negative for behavioral problems, confusion, decreased concentration, dysphoric mood, hallucinations and suicidal ideas. The patient is not nervous/anxious.     Past Medical History:  Diagnosis Date  . Anemia   . Cervical cancer ([place])   . SVD (spontaneous vaginal delivery)    x 1  . Thyroid disease      Social History   Socioeconomic History  . Marital status: Divorced    Spouse name: Not on file  . Number of children: Not on file  . Years of education: Not on file  . Highest education level: Not on file  Occupational History  . Not on file  Social Needs  . Financial resource strain: Not on file    . Food insecurity:    Worry: Not on file    Inability: Not on file  . Transportation needs:    Medical: Not on file    Non-medical: Not on file  Tobacco Use  . Smoking status: Never Smoker  . Smokeless tobacco: Never Used  Substance and Sexual Activity  . Alcohol use: No    Alcohol/week: 0.0 oz  . Drug use: No  . Sexual activity: Yes    Birth control/protection: Post-menopausal  Lifestyle  . Physical activity:    Days per week: Not on file    Minutes per session: Not on file  . Stress: Not on file  Relationships  . Social connections:    Talks on phone: Not on file    Gets together: Not on file    Attends religious service: Not on file    Active member of club or organization: Not on file    Attends meetings of clubs or organizations: Not on file    Relationship status: Not on file  . Intimate partner violence:    Fear of current or ex partner: Not on file    Emotionally abused: Not on file    Physically abused: Not on file    Forced sexual activity: Not on file  Other Topics Concern  . Not on file  Social History Narrative  . Not on file    Past Surgical History:  Procedure Laterality Date  . CERVICAL BIOPSY  W/ LOOP  ELECTRODE EXCISION     this is what pt describes.  . WISDOM TOOTH EXTRACTION      Family History  Problem Relation Age of Onset  . Healthy Mother   . Cancer Father        Kidney cancer  . Kidney disease Father 30       cancer  . Healthy Sister   . Colon cancer Neg Hx   . Colon polyps Neg Hx   . Esophageal cancer Neg Hx   . Stomach cancer Neg Hx   . Rectal cancer Neg Hx     No Known Allergies  Current Outpatient Medications on File Prior to Visit  Medication Sig Dispense Refill  . atorvastatin (LIPITOR) 10 MG tablet TAKE ONE TABLET BY MOUTH DAILY 30 tablet 2  . levothyroxine (SYNTHROID, LEVOTHROID) 75 MCG tablet Take 1 tablet (75 mcg total) by mouth daily. 30 tablet 0   No current facility-administered medications on file prior to  visit.     BP 110/62 (BP Location: Left Arm, Patient Position: Sitting, Cuff Size: Normal)   Pulse 89   Resp 16   Ht 5\' 1"  (1.549 m)   Wt 183 lb 12.8 oz (83.4 kg)   LMP 12/16/2013   SpO2 99%   BMI 34.73 kg/m       Objective:   Physical Exam  General Mental Status- Alert. General Appearance- Not in acute distress.   Skin General: Color- Normal Color. Moisture- Normal Moisture.  Neck Carotid Arteries- Normal color. Moisture- Normal Moisture. No carotid bruits. No JVD.  Chest and Lung Exam Auscultation: Breath Sounds:-Normal.  Cardiovascular Auscultation:Rythm- Regular. Murmurs & Other Heart Sounds:Auscultation of the heart reveals- No Murmurs.  Abdomen Inspection:-Inspeection Normal. Palpation/Percussion:Note:No mass. Palpation and Percussion of the abdomen reveal- Non Tender, Non Distended + BS, no rebound or guarding.    Neurologic Cranial Nerve exam:- CN III-XII intact(No nystagmus), symmetric smile. Strength:- 5/5 equal and symmetric strength both upper and lower extremities.  Rt shoulder-decreased range of motion. Restricted abduction of rt upper extremity. Can't lift above shoulder level.  Back- left side of back faint tender to light touch. Pain radiates toward left rib area in dermatomal pattern.   Skin- small are of redness of skin lateral rib area just beneath bra line.(Note no mid spinal tenderness). Some faint diffuse slight hyperpigmentatin of skin just beneath bra.     Assessment & Plan:  For your history of chronic right shoulder pain and decreased range of motion, I do have concern that you might have suffered rotator cuff injury in the past.  For moderate pain and inflammation I am prescribing diclofenac.  I went ahead and referred you to [facility] orthopedist.  After discussion we decided not to get x-ray.  Today as they will probably do your own x-ray.  In the and they might even possibly do MRI.  Your left side back pain with dermatomal  pattern of pain towards the left rib region may represent early shingles outbreak.  Only one faint area of rash present today.  I do think based on your neuropathic description of pain that it would be best to go ahead and treat with Famvir and make tramadol available for more severe pain.  Follow-up in 7 days to evaluate the skin area.  If you do get blister type eruption in the area of pain please give [place] an update by phone or send my chart message.  Mackie Pai, PA-C

## 2018-01-10 DIAGNOSIS — M25511 Pain in right shoulder: Secondary | ICD-10-CM | POA: Diagnosis not present

## 2018-01-10 DIAGNOSIS — M7541 Impingement syndrome of right shoulder: Secondary | ICD-10-CM | POA: Diagnosis not present

## 2018-01-13 ENCOUNTER — Encounter: Payer: Self-pay | Admitting: Medical

## 2018-01-13 ENCOUNTER — Ambulatory Visit: Payer: 59 | Admitting: Medical

## 2018-01-13 VITALS — BP 96/69 | HR 66 | Temp 98.0°F | Resp 16 | Ht 61.0 in | Wt 184.6 lb

## 2018-01-13 DIAGNOSIS — B029 Zoster without complications: Secondary | ICD-10-CM | POA: Diagnosis not present

## 2018-01-13 MED ORDER — FAMCICLOVIR 500 MG PO TABS: 500.0000 mg | ORAL_TABLET | Freq: Three times a day (TID) | ORAL | 0 refills | Status: DC

## 2018-01-13 NOTE — Progress Notes (Signed)
Subjective:    Patient ID: Kelsey Martinez, female    DOB: [birth year], 52 y.o.   MRN: [id number]  HPI  Pt in for evaluation of shingles like pain.  Pt has seen for early shingles outbreak. She has faint mild itch to area. But no longer sharp left side sharp dermatomal pain wrapping around.    Pt small area of rash did not worsen.   I gave 7 days of famivr.   She states she did not need pain medication.  Pt saw ortho. Xray of left shoulder was negative. The mri is pending.  Review of Systems  Constitutional: Negative for chills, fatigue and fever.  Respiratory: Negative for chest tightness, shortness of breath and wheezing.   Cardiovascular: Negative for chest pain and palpitations.  Musculoskeletal:       Left shoulder pain.  Skin: Negative for rash.       See hpi.  Hematological: Negative for adenopathy. Does not bruise/bleed easily.  Psychiatric/Behavioral: Negative for behavioral problems, decreased concentration, sleep disturbance and suicidal ideas. The patient is not nervous/anxious.     Past Medical History:  Diagnosis Date  . Anemia   . Cervical cancer ([place])   . SVD (spontaneous vaginal delivery)    x 1  . Thyroid disease      Social History   Socioeconomic History  . Marital status: Divorced    Spouse name: Not on file  . Number of children: Not on file  . Years of education: Not on file  . Highest education level: Not on file  Occupational History  . Not on file  Social Needs  . Financial resource strain: Not on file  . Food insecurity:    Worry: Not on file    Inability: Not on file  . Transportation needs:    Medical: Not on file    Non-medical: Not on file  Tobacco Use  . Smoking status: Never Smoker  . Smokeless tobacco: Never Used  Substance and Sexual Activity  . Alcohol use: No    Alcohol/week: 0.0 oz  . Drug use: No  . Sexual activity: Yes    Birth control/protection: Post-menopausal  Lifestyle  . Physical activity:    Days per week:  Not on file    Minutes per session: Not on file  . Stress: Not on file  Relationships  . Social connections:    Talks on phone: Not on file    Gets together: Not on file    Attends religious service: Not on file    Active member of club or organization: Not on file    Attends meetings of clubs or organizations: Not on file    Relationship status: Not on file  . Intimate partner violence:    Fear of current or ex partner: Not on file    Emotionally abused: Not on file    Physically abused: Not on file    Forced sexual activity: Not on file  Other Topics Concern  . Not on file  Social History Narrative  . Not on file    Past Surgical History:  Procedure Laterality Date  . CERVICAL BIOPSY  W/ LOOP ELECTRODE EXCISION     this is what pt describes.  . WISDOM TOOTH EXTRACTION      Family History  Problem Relation Age of Onset  . Healthy Mother   . Cancer Father        Kidney cancer  . Kidney disease Father 64  cancer  . Healthy Sister   . Colon cancer Neg Hx   . Colon polyps Neg Hx   . Esophageal cancer Neg Hx   . Stomach cancer Neg Hx   . Rectal cancer Neg Hx     No Known Allergies  Current Outpatient Medications on File Prior to Visit  Medication Sig Dispense Refill  . atorvastatin (LIPITOR) 10 MG tablet TAKE ONE TABLET BY MOUTH DAILY 30 tablet 2  . diclofenac (VOLTAREN) 75 MG EC tablet Take 1 tablet (75 mg total) by mouth 2 (two) times daily. 30 tablet 0  . levothyroxine (SYNTHROID, LEVOTHROID) 75 MCG tablet Take 1 tablet (75 mcg total) by mouth daily. 30 tablet 0   No current facility-administered medications on file prior to visit.     BP 96/69   Pulse 66   Temp 98 F (36.7 C) (Oral)   Resp 16   Ht 5\' 1"  (1.549 m)   Wt 184 lb 9.6 oz (83.7 kg)   LMP 12/16/2013   SpO2 97%   BMI 34.88 kg/m       Objective:   Physical Exam  General Mental Status- Alert. General Appearance- Not in acute distress.   Skin General: Color- Normal Color.  Moisture- Normal Moisture.  Neck Carotid Arteries- Normal color. Moisture- Normal Moisture. No carotid bruits. No JVD.  Chest and Lung Exam Auscultation: Breath Sounds:-Normal.  Cardiovascular Auscultation:Rythm- Regular. Murmurs & Other Heart Sounds:Auscultation of the heart reveals- No Murmurs.  Abdomen Inspection:-Inspeection Normal. Palpation/Percussion:Note:No mass. Palpation and Percussion of the abdomen reveal- Non Tender, Non Distended + BS, no rebound or guarding.    Neurologic Cranial Nerve exam:- CN III-XII intact(No nystagmus), symmetric smile. Strength:- 5/5 equal and symmetric strength both upper and lower extremities.  Rt shoulder-decreased range of motion. Restricted abduction of rt upper extremity. Can't lift above shoulder level.  Back- left side of back faint tender to light touch. Pain radiates toward left rib area in dermatomal pattern.   Skin- small are of faint hyperpigmented  of skin lateral rib area just beneath bra line.(Note no mid spinal tenderness). Some faint diffuse slight hyperpigmentatin of skin just beneath bra.      Assessment & Plan:  For shingles which appears to be resolving will prescribe additional 5 days of famvir and no further need after 5 days.  No further need for pain medications.  I do think best to hold off on use of steroid injection for shoulder with recent shingles. I do think ok to use steroid in about one month.   I do think can get shingles vaccine in about 3-4 months. But not during active infection.  Follow up in about 6 months or as needed  [employer], [employer]

## 2018-01-13 NOTE — Patient Instructions (Addendum)
For shingles which appears to be resolving will prescribe additional 5 days of famvir and no further need after 5 days.  No further need for pain medications.  I do think best to hold off on use of steroid injection for shoulder with recent shingles. I do think ok to use steroid in about one month.   I do think can get shingles vaccine in about 3-4 months. But not during active infection.  Follow up in about 6 months or as needed

## 2018-01-16 IMAGING — US US BREAST LTD UNI RIGHT INC AXILLA
1 series · 1 of 1 positions shown · non-contrast
Comparison: None

CLINICAL DATA: Patient's referring clinician reports a palpable
lump in the lateral aspect of the right breast.

EXAM:
DIGITAL DIAGNOSTIC BILATERAL MAMMOGRAM WITH 3D TOMOSYNTHESIS WITH
CAD
ULTRASOUND RIGHT BREAST

[Series 1: advbreast · 1 of 1 slices shown]
[im 1/1]
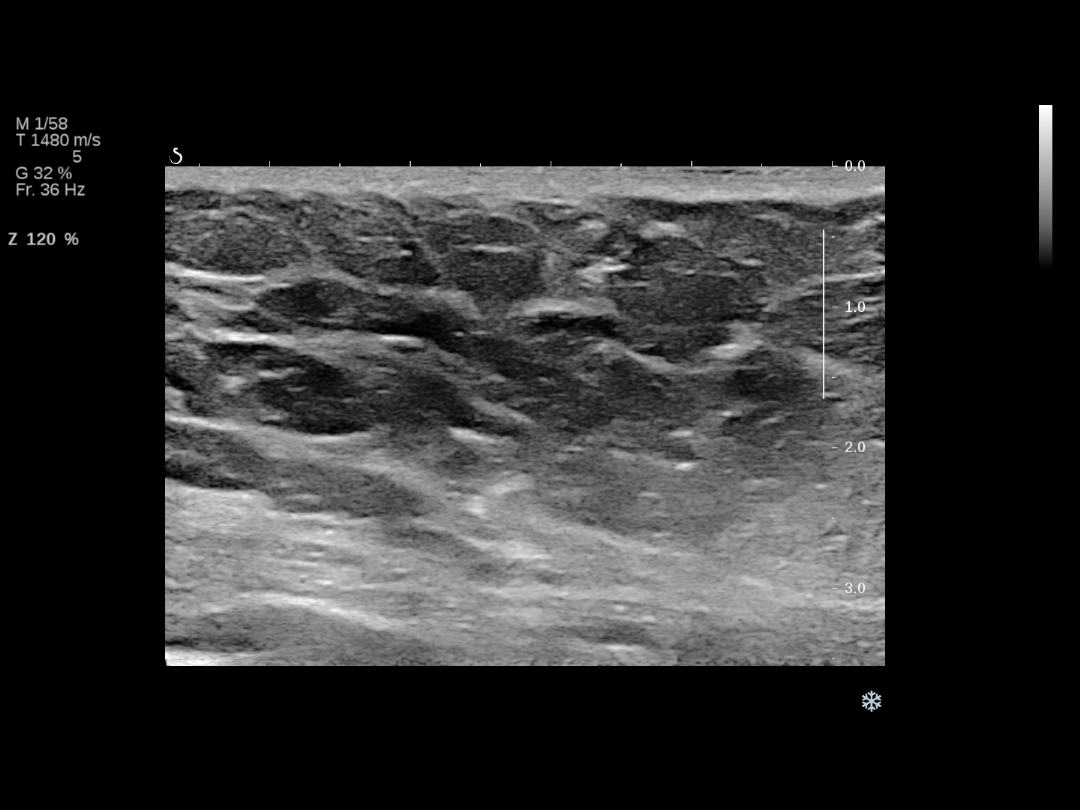

[1 of 1 positions shown; findings below may reference images not displayed]

ACR Breast Density Category c: The breast tissue is heterogeneously
dense, which may obscure small masses.
FINDINGS: There are no discrete masses, areas of architectural distortion,
areas of significant asymmetry or suspicious calcifications.

Mammographic images were processed with CAD.

On physical exam, no discrete mass is palpated in the lateral right
breast.

Targeted ultrasound is performed, showing a heterogeneous
fibroglandular tissue with a small cysts, but no solid masses or
suspicious lesions.
IMPRESSION: Few benign cysts in the lateral right breast. No evidence of
malignancy.

RECOMMENDATION:
Screening mammogram in one year.(Code:I1-6-KLD)

I have discussed the findings and recommendations with the patient.
Results were also provided in writing at the conclusion of the
visit. If applicable, a reminder letter will be sent to the patient
regarding the next appointment.

BI-RADS CATEGORY  2: Benign.

## 2018-01-24 MED FILL — ATORVASTATIN 10 MG TABLET: 10 | 30 days supply | Qty: 30 | Fill #1

## 2018-01-24 MED FILL — LEVOTHYROXINE 75 MCG TABLET: 75 | 30 days supply | Qty: 30 | Fill #2

## 2018-02-06 ENCOUNTER — Encounter: Payer: Self-pay | Admitting: Obstetrics and Gynecology

## 2018-03-05 ENCOUNTER — Other Ambulatory Visit: Payer: Self-pay | Admitting: Medical

## 2018-03-20 DIAGNOSIS — E039 Hypothyroidism, unspecified: Secondary | ICD-10-CM | POA: Diagnosis not present

## 2018-03-25 ENCOUNTER — Other Ambulatory Visit: Payer: Self-pay | Admitting: Medical

## 2018-03-26 ENCOUNTER — Encounter: Payer: Self-pay | Admitting: Medical

## 2018-03-26 DIAGNOSIS — D649 Anemia, unspecified: Secondary | ICD-10-CM | POA: Insufficient documentation

## 2018-03-26 DIAGNOSIS — E611 Iron deficiency: Secondary | ICD-10-CM | POA: Insufficient documentation

## 2018-03-26 NOTE — Telephone Encounter (Signed)
Ferrous sulfate request, but not on medication list. No indication clearly documented. Routed to [place], [place].

## 2018-03-27 DIAGNOSIS — M8580 Other specified disorders of bone density and structure, unspecified site: Secondary | ICD-10-CM | POA: Diagnosis not present

## 2018-04-02 ENCOUNTER — Other Ambulatory Visit: Payer: Self-pay | Admitting: Medical

## 2018-04-14 DIAGNOSIS — M25511 Pain in right shoulder: Secondary | ICD-10-CM | POA: Diagnosis not present

## 2018-04-21 DIAGNOSIS — M5412 Radiculopathy, cervical region: Secondary | ICD-10-CM | POA: Diagnosis not present

## 2018-04-21 DIAGNOSIS — M7501 Adhesive capsulitis of right shoulder: Secondary | ICD-10-CM | POA: Diagnosis not present

## 2018-04-21 DIAGNOSIS — E039 Hypothyroidism, unspecified: Secondary | ICD-10-CM | POA: Diagnosis not present

## 2018-04-21 DIAGNOSIS — M7541 Impingement syndrome of right shoulder: Secondary | ICD-10-CM | POA: Diagnosis not present

## 2018-05-02 ENCOUNTER — Other Ambulatory Visit: Payer: Self-pay | Admitting: Medical

## 2018-05-09 DIAGNOSIS — M25511 Pain in right shoulder: Secondary | ICD-10-CM | POA: Diagnosis not present

## 2018-05-09 DIAGNOSIS — M7501 Adhesive capsulitis of right shoulder: Secondary | ICD-10-CM | POA: Diagnosis not present

## 2018-05-13 DIAGNOSIS — M7501 Adhesive capsulitis of right shoulder: Secondary | ICD-10-CM | POA: Diagnosis not present

## 2018-05-13 DIAGNOSIS — E039 Hypothyroidism, unspecified: Secondary | ICD-10-CM | POA: Diagnosis not present

## 2018-05-13 DIAGNOSIS — M25511 Pain in right shoulder: Secondary | ICD-10-CM | POA: Diagnosis not present

## 2018-05-14 DIAGNOSIS — M7501 Adhesive capsulitis of right shoulder: Secondary | ICD-10-CM | POA: Diagnosis not present

## 2018-05-14 DIAGNOSIS — M7541 Impingement syndrome of right shoulder: Secondary | ICD-10-CM | POA: Diagnosis not present

## 2018-05-14 DIAGNOSIS — M25511 Pain in right shoulder: Secondary | ICD-10-CM | POA: Diagnosis not present

## 2018-05-15 DIAGNOSIS — M7501 Adhesive capsulitis of right shoulder: Secondary | ICD-10-CM | POA: Diagnosis not present

## 2018-05-15 DIAGNOSIS — M25511 Pain in right shoulder: Secondary | ICD-10-CM | POA: Diagnosis not present

## 2018-05-19 DIAGNOSIS — M25511 Pain in right shoulder: Secondary | ICD-10-CM | POA: Diagnosis not present

## 2018-05-19 DIAGNOSIS — M7501 Adhesive capsulitis of right shoulder: Secondary | ICD-10-CM | POA: Diagnosis not present

## 2018-05-22 DIAGNOSIS — M25511 Pain in right shoulder: Secondary | ICD-10-CM | POA: Diagnosis not present

## 2018-05-22 DIAGNOSIS — M7501 Adhesive capsulitis of right shoulder: Secondary | ICD-10-CM | POA: Diagnosis not present

## 2018-05-27 DIAGNOSIS — M7501 Adhesive capsulitis of right shoulder: Secondary | ICD-10-CM | POA: Diagnosis not present

## 2018-05-27 DIAGNOSIS — M25511 Pain in right shoulder: Secondary | ICD-10-CM | POA: Diagnosis not present

## 2018-05-29 DIAGNOSIS — M25511 Pain in right shoulder: Secondary | ICD-10-CM | POA: Diagnosis not present

## 2018-05-29 DIAGNOSIS — M7501 Adhesive capsulitis of right shoulder: Secondary | ICD-10-CM | POA: Diagnosis not present

## 2018-06-03 DIAGNOSIS — M25511 Pain in right shoulder: Secondary | ICD-10-CM | POA: Diagnosis not present

## 2018-06-03 DIAGNOSIS — M7501 Adhesive capsulitis of right shoulder: Secondary | ICD-10-CM | POA: Diagnosis not present

## 2018-06-05 DIAGNOSIS — M25511 Pain in right shoulder: Secondary | ICD-10-CM | POA: Diagnosis not present

## 2018-06-05 DIAGNOSIS — M7501 Adhesive capsulitis of right shoulder: Secondary | ICD-10-CM | POA: Diagnosis not present

## 2018-06-12 ENCOUNTER — Other Ambulatory Visit: Payer: Self-pay | Admitting: Medical

## 2018-06-19 DIAGNOSIS — E039 Hypothyroidism, unspecified: Secondary | ICD-10-CM | POA: Diagnosis not present

## 2018-06-27 DIAGNOSIS — M7541 Impingement syndrome of right shoulder: Secondary | ICD-10-CM | POA: Diagnosis not present

## 2018-06-27 DIAGNOSIS — M5412 Radiculopathy, cervical region: Secondary | ICD-10-CM | POA: Diagnosis not present

## 2018-06-27 DIAGNOSIS — M25511 Pain in right shoulder: Secondary | ICD-10-CM | POA: Diagnosis not present

## 2018-07-11 ENCOUNTER — Other Ambulatory Visit: Payer: Self-pay | Admitting: Medical

## 2018-08-08 ENCOUNTER — Other Ambulatory Visit: Payer: Self-pay | Admitting: Medical

## 2018-08-20 DIAGNOSIS — E039 Hypothyroidism, unspecified: Secondary | ICD-10-CM | POA: Diagnosis not present

## 2018-09-12 DIAGNOSIS — M7671 Peroneal tendinitis, right leg: Secondary | ICD-10-CM | POA: Diagnosis not present

## 2018-09-12 DIAGNOSIS — M19071 Primary osteoarthritis, right ankle and foot: Secondary | ICD-10-CM | POA: Diagnosis not present

## 2018-09-15 ENCOUNTER — Ambulatory Visit: Payer: 59 | Admitting: Medical

## 2018-10-06 ENCOUNTER — Other Ambulatory Visit: Payer: Self-pay | Admitting: Medical

## 2018-10-22 DIAGNOSIS — E559 Vitamin D deficiency, unspecified: Secondary | ICD-10-CM | POA: Diagnosis not present

## 2018-11-27 ENCOUNTER — Other Ambulatory Visit: Payer: Self-pay | Admitting: Medical

## 2018-11-27 NOTE — Telephone Encounter (Signed)
Called pt and schedule pt appt for 12-02-2018 at 8:20. Done

## 2018-11-27 NOTE — Telephone Encounter (Signed)
Pt is due for follow up please call and schedule appointment.  

## 2018-12-02 ENCOUNTER — Ambulatory Visit: Payer: 59 | Admitting: Medical

## 2018-12-08 ENCOUNTER — Ambulatory Visit: Payer: 59 | Admitting: Medical

## 2018-12-08 ENCOUNTER — Telehealth: Payer: Self-pay | Admitting: Medical

## 2018-12-08 ENCOUNTER — Encounter: Payer: Self-pay | Admitting: Medical

## 2018-12-08 VITALS — BP 107/75 | HR 67 | Temp 97.8°F | Resp 16 | Ht 61.0 in | Wt 187.8 lb

## 2018-12-08 DIAGNOSIS — R748 Abnormal levels of other serum enzymes: Secondary | ICD-10-CM | POA: Diagnosis not present

## 2018-12-08 DIAGNOSIS — D649 Anemia, unspecified: Secondary | ICD-10-CM

## 2018-12-08 DIAGNOSIS — E785 Hyperlipidemia, unspecified: Secondary | ICD-10-CM

## 2018-12-08 DIAGNOSIS — E039 Hypothyroidism, unspecified: Secondary | ICD-10-CM | POA: Diagnosis not present

## 2018-12-08 DIAGNOSIS — J3489 Other specified disorders of nose and nasal sinuses: Secondary | ICD-10-CM

## 2018-12-08 LAB — CBC WITH DIFFERENTIAL/PLATELET
BASOS ABS: 0 10*3/uL (ref 0.0–0.1)
BASOS PCT: 0.6 % (ref 0.0–3.0)
EOS ABS: 0.1 10*3/uL (ref 0.0–0.7)
Eosinophils Relative: 2.2 % (ref 0.0–5.0)
HCT: 42.9 % (ref 36.0–46.0)
Hemoglobin: 14.3 g/dL (ref 12.0–15.0)
Lymphocytes Relative: 35.6 % (ref 12.0–46.0)
Lymphs Abs: 2.2 10*3/uL (ref 0.7–4.0)
MCHC: 33.5 g/dL (ref 30.0–36.0)
MCV: 94.8 fl (ref 78.0–100.0)
MONO ABS: 0.3 10*3/uL (ref 0.1–1.0)
Monocytes Relative: 5.1 % (ref 3.0–12.0)
NEUTROS ABS: 3.5 10*3/uL (ref 1.4–7.7)
Neutrophils Relative %: 56.5 % (ref 43.0–77.0)
PLATELETS: 218 10*3/uL (ref 150.0–400.0)
RBC: 4.52 Mil/uL (ref 3.87–5.11)
RDW: 13.8 % (ref 11.5–15.5)
WBC: 6.2 10*3/uL (ref 4.0–10.5)

## 2018-12-08 LAB — COMPREHENSIVE METABOLIC PANEL
ALT: 38 U/L — ABNORMAL HIGH (ref 0–35)
AST: 34 U/L (ref 0–37)
Albumin: 3.5 g/dL (ref 3.5–5.2)
Alkaline Phosphatase: 62 U/L (ref 39–117)
BUN: 14 mg/dL (ref 6–23)
CHLORIDE: 110 meq/L (ref 96–112)
CO2: 28 meq/L (ref 19–32)
CREATININE: 0.78 mg/dL (ref 0.40–1.20)
Calcium: 8.7 mg/dL (ref 8.4–10.5)
GFR: 77.09 mL/min (ref >60.00)
Glucose, Bld: 86 mg/dL (ref 70–99)
Potassium: 5 mEq/L (ref 3.5–5.1)
SODIUM: 144 meq/L (ref 135–145)
Total Bilirubin: 0.3 mg/dL (ref 0.2–1.2)
Total Protein: 5 g/dL — ABNORMAL LOW (ref 6.0–8.3)

## 2018-12-08 LAB — LIPID PANEL
CHOL/HDL RATIO: 3
Cholesterol: 151 mg/dL (ref 0–200)
HDL: 43.8 mg/dL (ref >39.00)
LDL CALC: 86 mg/dL (ref 0–99)
NONHDL: 107.48
Triglycerides: 106 mg/dL (ref 0.0–149.0)
VLDL: 21.2 mg/dL (ref 0.0–40.0)

## 2018-12-08 LAB — T4, FREE: FREE T4: 0.95 ng/dL (ref 0.60–1.60)

## 2018-12-08 LAB — TSH: TSH: 6.4 u[IU]/mL — ABNORMAL HIGH (ref 0.35–4.50)

## 2018-12-08 MED ORDER — LEVOTHYROXINE SODIUM 88 MCG PO TABS: 88.0000 ug | ORAL_TABLET | Freq: Every day | ORAL | 3 refills | Status: DC

## 2018-12-08 NOTE — Progress Notes (Signed)
Subjective:    Patient ID: Kelsey Martinez, female    DOB: 1966/04/22, 53 y.o.   MRN: [id number]  HPI  Pt in for follow up.   She is fasting.  History of anemia, low thyroid, mild liver enzyme elevation  and high cholesterol.   Regarding pt elevated liver enzymes, she does not report any recent rt upper quadrant/abdomen pain after eating.  Pt states up to date on mammogram. She states her gyn calls he and reminds her.  See hpi for hent. Pt tried flonase in past but did not help.   Review of Systems  Constitutional: Negative for chills, fatigue and fever.  HENT: Positive for congestion and rhinorrhea. Negative for ear pain, sinus pressure, sneezing and sore throat.        Some occasional scabs on and off in nose. Nose feels dry at times. Hx of deviated septum  Respiratory: Negative for cough, chest tightness, shortness of breath and wheezing.   Cardiovascular: Negative for chest pain and palpitations.  Gastrointestinal: Negative for abdominal pain.  Genitourinary: Negative for dyspareunia and dysuria.  Musculoskeletal: Negative for back pain.  Skin: Negative for pallor.  Neurological: Negative for dizziness, speech difficulty, weakness and light-headedness.  Hematological: Negative for adenopathy. Does not bruise/bleed easily.  Psychiatric/Behavioral: Negative for confusion.   Past Medical History:  Diagnosis Date  . Anemia   . Cervical cancer ([place])   . SVD (spontaneous vaginal delivery)    x 1  . Thyroid disease      Social History   Socioeconomic History  . Marital status: Divorced    Spouse name: Not on file  . Number of children: Not on file  . Years of education: Not on file  . Highest education level: Not on file  Occupational History  . Not on file  Social Needs  . Financial resource strain: Not on file  . Food insecurity:    Worry: Not on file    Inability: Not on file  . Transportation needs:    Medical: Not on file    Non-medical: Not on file  Tobacco  Use  . Smoking status: Never Smoker  . Smokeless tobacco: Never Used  Substance and Sexual Activity  . Alcohol use: No    Alcohol/week: 0.0 standard drinks  . Drug use: No  . Sexual activity: Yes    Birth control/protection: Post-menopausal  Lifestyle  . Physical activity:    Days per week: Not on file    Minutes per session: Not on file  . Stress: Not on file  Relationships  . Social connections:    Talks on phone: Not on file    Gets together: Not on file    Attends religious service: Not on file    Active member of club or organization: Not on file    Attends meetings of clubs or organizations: Not on file    Relationship status: Not on file  . Intimate partner violence:    Fear of current or ex partner: Not on file    Emotionally abused: Not on file    Physically abused: Not on file    Forced sexual activity: Not on file  Other Topics Concern  . Not on file  Social History Narrative  . Not on file    Past Surgical History:  Procedure Laterality Date  . CERVICAL BIOPSY  W/ LOOP ELECTRODE EXCISION     this is what pt describes.  . WISDOM TOOTH EXTRACTION      Family History  Problem Relation Age of Onset  . Healthy Mother   . Cancer Father        Kidney cancer  . Kidney disease Father 48       cancer  . Healthy Sister   . Colon cancer Neg Hx   . Colon polyps Neg Hx   . Esophageal cancer Neg Hx   . Stomach cancer Neg Hx   . Rectal cancer Neg Hx     No Known Allergies  Current Outpatient Medications on File Prior to Visit  Medication Sig Dispense Refill  . atorvastatin (LIPITOR) 10 MG tablet TAKE ONE TABLET BY MOUTH DAILY 30 tablet 2  . ferrous sulfate 325 (65 FE) MG tablet TAKE 1 TABLET (325 MG TOTAL) BY MOUTH 2 (TWO) TIMES DAILY WITH A MEAL. 180 tablet 1  . levothyroxine (SYNTHROID, LEVOTHROID) 75 MCG tablet Take 1 tablet (75 mcg total) by mouth daily. 30 tablet 0  . Vitamin D, Ergocalciferol, (DRISDOL) 1.25 MG (50000 UT) CAPS capsule TAKE 1 CAPSULE BY  MOUTH ONE TIME PER WEEK     No current facility-administered medications on file prior to visit.     BP 107/75   Pulse 67   Temp 97.8 F (36.6 C) (Oral)   Resp 16   Ht 5\' 1"  (1.549 m)   Wt 187 lb 12.8 oz (85.2 kg)   LMP 12/16/2013   SpO2 99%   BMI 35.48 kg/m       Objective:   Physical Exam  General Mental Status- Alert. General Appearance- Not in acute distress.   Skin General: Color- Normal Color. Moisture- Normal Moisture.  Neck Carotid Arteries- Normal color. Moisture- Normal Moisture. No carotid bruits. No JVD.  Chest and Lung Exam Auscultation: Breath Sounds:-Normal.  Cardiovascular Auscultation:Rythm- Regular. Murmurs & Other Heart Sounds:Auscultation of the heart reveals- No Murmurs.  Abdomen Inspection:-Inspeection Normal. Palpation/Percussion:Note:No mass. Palpation and Percussion of the abdomen reveal- Non Tender, Non Distended + BS, no rebound or guarding.    Neurologic Cranial Nerve exam:- CN III-XII intact(No nystagmus), symmetric smile. Strength:- 5/5 equal and symmetric strength both upper and lower extremities.    HEENT Head- Normal. Ear Auditory Canal - Left- Normal. Right - Normal.Tympanic Membrane- Left- Normal. Right- Normal. Eye Sclera/Conjunctiva- Left- Normal. Right- Normal. Nose & Sinuses Nasal Mucosa- Left-  Boggy and Congested. Right-  Boggy and  Congested.Bilateral no maxillary and no  frontal sinus pressure. Nares mild swollen. No lesions on inspection inside nares. Mouth & Throat Lips: Upper Lip- Normal: no dryness, cracking, pallor, cyanosis, or vesicular eruption. Lower Lip-Normal: no dryness, cracking, pallor, cyanosis or vesicular eruption. Buccal Mucosa- Bilateral- No Aphthous ulcers. Oropharynx- No Discharge or Erythema. Tonsils: Characteristics- Bilateral- No Erythema or Congestion. Size/Enlargement- Bilateral- No enlargement. Discharge- bilateral-None.         Assessment & Plan:  For your history of anemia,  hypothyroidism, transient liver enzyme elevation, and hyperlipidemia, I am placing the below listed labs.  Please get those done today.  We will update you on those results and see if we need to make any changes to your medication regimen.  For your nose dryness, I would recommend that you use humidifier in your room nightly.  If you get more congested as we approached spring, then could offer you a nasal spray called Astelin.  Also with your history of intermittent scabbing in your nose, would asked that you give me an update if your nostril start to feel sore.  Sometimes there is bacteria that can reside inside the nose  and cause intermittent infections.  If you do get nasal soreness then would prescribe doxycycline antibiotic.  Follow-up date to be determined after lab review.  Mackie Pai, PA-C

## 2018-12-08 NOTE — Patient Instructions (Signed)
For your history of anemia, hypothyroidism, transient liver enzyme elevation, and hyperlipidemia, I am placing the below listed labs.  Please get those done today.  We will update you on those results and see if we need to make any changes to your medication regimen.  For your nose dryness, I would recommend that you use humidifier in your room nightly.  If you get more congested as we approached spring, then could offer you a nasal spray called Astelin.  Also with your history of intermittent scabbing in your nose, would asked that you give me an update if your nostril start to feel sore.  Sometimes there is bacteria that can reside inside the nose and cause intermittent infections.  If you do get nasal soreness then would prescribe doxycycline antibiotic.  Follow-up date to be determined after lab review.

## 2018-12-08 NOTE — Telephone Encounter (Signed)
Rx higher dose levothyroxine sent to pt pharmacy.

## 2018-12-10 ENCOUNTER — Telehealth: Payer: Self-pay

## 2018-12-10 NOTE — Telephone Encounter (Signed)
Faxed results to Dr. Lenise Arena .

## 2018-12-10 NOTE — Telephone Encounter (Signed)
Copied from [place] [id number]. Topic: Medical Record Request - Other >> Dec 10, 2018  9:44 AM Lennox Solders wrote: Reason for CRM: pt  endocrinologist  dr balan  would like a copy of her tsh result fax to them at [phone] . Pt was referral by the office years ago

## 2018-12-28 ENCOUNTER — Other Ambulatory Visit: Payer: Self-pay | Admitting: Medical

## 2019-01-02 DIAGNOSIS — Z6835 Body mass index (BMI) 35.0-35.9, adult: Secondary | ICD-10-CM | POA: Diagnosis not present

## 2019-01-02 DIAGNOSIS — Z01419 Encounter for gynecological examination (general) (routine) without abnormal findings: Secondary | ICD-10-CM | POA: Diagnosis not present

## 2019-02-25 ENCOUNTER — Encounter: Payer: Self-pay | Admitting: Gastroenterology

## 2019-03-06 ENCOUNTER — Ambulatory Visit: Payer: 59 | Admitting: *Deleted

## 2019-03-06 ENCOUNTER — Other Ambulatory Visit: Payer: Self-pay

## 2019-03-06 ENCOUNTER — Encounter: Payer: Self-pay | Admitting: Gastroenterology

## 2019-03-06 VITALS — Ht 60.0 in | Wt 170.0 lb

## 2019-03-06 DIAGNOSIS — Z800 Family history of malignant neoplasm of digestive organs: Secondary | ICD-10-CM

## 2019-03-06 MED ORDER — NA SULFATE-K SULFATE-MG SULF 17.5-3.13-1.6 GM/177ML PO SOLN: 1.0000 | Freq: Once | ORAL | 0 refills | Status: AC

## 2019-03-06 NOTE — Progress Notes (Signed)
No egg or soy allergy known to patient  No issues with past sedation with any surgeries  or procedures, no intubation problems  No diet pills per patient No home 02 use per patient  No blood thinners per patient  Pt denies issues with constipation  No A fib or A flutter     Pt verified name, DOB, address and insurance during PV today. Pt mailed instruction packet to included paper to complete and mail back to [facility] with addressed and stamped envelope,  copy of consent form to read and not return, and instructions.  PV completed over the phone. Pt encouraged to call with questions or issues  

## 2019-03-18 ENCOUNTER — Telehealth: Payer: Self-pay | Admitting: *Deleted

## 2019-03-18 NOTE — Telephone Encounter (Signed)
Covid-19 screening questions  Have you traveled in the last 14 days? no If yes where?  Do you now or have you had a fever in the last 14 days? no  Do you have any respiratory symptoms of shortness of breath or cough now or in the last 14 days? no  Do you have any family members or close contacts with diagnosed or suspected Covid-19 in the past 14 days? no  Have you been tested for Covid-19 and found to be positive? no       

## 2019-03-20 ENCOUNTER — Encounter: Payer: Self-pay | Admitting: Gastroenterology

## 2019-03-20 ENCOUNTER — Other Ambulatory Visit: Payer: Self-pay

## 2019-03-20 ENCOUNTER — Ambulatory Visit (AMBULATORY_SURGERY_CENTER): Payer: 59 | Admitting: Gastroenterology

## 2019-03-20 VITALS — BP 109/74 | HR 67 | Temp 98.4°F | Resp 16 | Ht 61.0 in | Wt 187.0 lb

## 2019-03-20 DIAGNOSIS — Z800 Family history of malignant neoplasm of digestive organs: Secondary | ICD-10-CM

## 2019-03-20 DIAGNOSIS — Z1211 Encounter for screening for malignant neoplasm of colon: Secondary | ICD-10-CM | POA: Diagnosis not present

## 2019-03-20 DIAGNOSIS — Z538 Procedure and treatment not carried out for other reasons: Secondary | ICD-10-CM

## 2019-03-20 MED ORDER — SODIUM CHLORIDE 0.9 % IV SOLN: 500.0000 mL | Freq: Once | INTRAVENOUS | Status: DC

## 2019-03-20 NOTE — Progress Notes (Signed)
Nancy Campbell, LPN- Temp Judy Branson, CMA- Vitals 

## 2019-03-20 NOTE — Progress Notes (Signed)
Pt's states no medical or surgical changes since previsit or office visit. 

## 2019-03-20 NOTE — Patient Instructions (Signed)
You had an incomplete colonoscopy.  We have scheduled your next colonoscopy for Tues 6/23 at 0830 for a 0730 arrival.  You will have a pre-visit on Mon 6/15 via e-visit.  YOU HAD AN ENDOSCOPIC PROCEDURE TODAY AT [facility]:   Refer to the procedure report that was given to you for any specific questions about what was found during the examination.  If the procedure report does not answer your questions, please call your gastroenterologist to clarify.  If you requested that your care partner not be given the details of your procedure findings, then the procedure report has been included in a sealed envelope for you to review at your convenience later.  YOU SHOULD EXPECT: Some feelings of bloating in the abdomen. Passage of more gas than usual.  Walking can help get rid of the air that was put into your GI tract during the procedure and reduce the bloating. If you had a lower endoscopy (such as a colonoscopy or flexible sigmoidoscopy) you may notice spotting of blood in your stool or on the toilet paper. If you underwent a bowel prep for your procedure, you may not have a normal bowel movement for a few days.  Please Note:  You might notice some irritation and congestion in your nose or some drainage.  This is from the oxygen used during your procedure.  There is no need for concern and it should clear up in a day or so.  SYMPTOMS TO REPORT IMMEDIATELY:   Following lower endoscopy (colonoscopy or flexible sigmoidoscopy):  Excessive amounts of blood in the stool  Significant tenderness or worsening of abdominal pains  Swelling of the abdomen that is new, acute  Fever of 100F or higher For urgent or emergent issues, a gastroenterologist can be reached at any hour by calling [phone].   DIET:  We do recommend a small meal at first, but then you may proceed to your regular diet.  Drink plenty of fluids but you should avoid alcoholic beverages for 24 hours.  ACTIVITY:  You  should plan to take it easy for the rest of today and you should NOT DRIVE or use heavy machinery until tomorrow (because of the sedation medicines used during the test).    FOLLOW UP: Our staff will call the number listed on your records 48-72 hours following your procedure to check on you and address any questions or concerns that you may have regarding the information given to you following your procedure. If we do not reach you, we will leave a message.  We will attempt to reach you two times.  During this call, we will ask if you have developed any symptoms of COVID 19. If you develop any symptoms (ie: fever, flu-like symptoms, shortness of breath, cough etc.) before then, please call [phone].  If you test positive for Covid 19 in the 2 weeks post procedure, please call and report this information to [place].    If any biopsies were taken you will be contacted by phone or by letter within the next 1-3 weeks.  Please call us at [phone] if you have not heard about the biopsies in 3 weeks.    SIGNATURES/CONFIDENTIALITY: You and/or your care partner have signed paperwork which will be entered into your electronic medical record.  These signatures attest to the fact that that the information above on your After Visit Summary has been reviewed and is understood.  Full responsibility of the confidentiality of this discharge information lies with  you and/or your care-partner.

## 2019-03-20 NOTE — Op Note (Signed)
[facility] Patient Name: Kelsey Martinez Procedure Date: 03/20/2019 8:29 AM MRN: [id number] Endoscopist: Mauri Pole , MD Age: 53 Referring MD:  Date of Birth: [birth year] Gender: Female Account #: [id number] Procedure:                Colonoscopy Indications:              Screening in patient at increased risk: Family                            history of 1st-degree relative with colorectal                            cancer before age 79 years Medicines:                Monitored Anesthesia Care Procedure:                Pre-Anesthesia Assessment:                           - Prior to the procedure, a History and Physical                            was performed, and patient medications and                            allergies were reviewed. The patient's tolerance of                            previous anesthesia was also reviewed. The risks                            and benefits of the procedure and the sedation                            options and risks were discussed with the patient.                            All questions were answered, and informed consent                            was obtained. Prior Anticoagulants: The patient has                            taken no previous anticoagulant or antiplatelet                            agents. ASA Grade Assessment: II - A patient with                            mild systemic disease. After reviewing the risks                            and benefits, the patient was deemed in  satisfactory condition to undergo the procedure.                           After obtaining informed consent, the colonoscope                            was passed under direct vision. Throughout the                            procedure, the patient's blood pressure, pulse, and                            oxygen saturations were monitored continuously. The                            Colonoscope was introduced through the  anus with                            the intention of advancing to the cecum. The scope                            was advanced to the hepatic flexure before the                            procedure was aborted. Medications were given. The                            colonoscopy was technically difficult and complex                            due to inadequate bowel prep. The patient tolerated                            the procedure well. The quality of the bowel                            preparation was poor. The rectum was photographed. Scope In: 8:45:27 AM Scope Out: 8:51:23 AM Total Procedure Duration: 0 hours 5 minutes 56 seconds  Findings:                 The perianal and digital rectal examinations were                            normal.                           A moderate amount of stool was found in the entire                            colon, interfering with visualization. Complications:            No immediate complications. Estimated Blood Loss:     Estimated blood loss: none. Impression:               - Preparation of the colon was  poor.                           - Stool in the entire examined colon.                           - No specimens collected. Recommendation:           - Patient has a contact number available for                            emergencies. The signs and symptoms of potential                            delayed complications were discussed with the                            patient. Return to normal activities tomorrow.                            Written discharge instructions were provided to the                            patient.                           - Resume previous diet.                           - Continue present medications.                           - Repeat colonoscopy at the next available                            appointment with golytly and Reglan 5mg  q6h prn                            because the bowel preparation was  suboptimal. Mauri Pole, MD 03/20/2019 8:57:05 AM This report has been signed electronically.

## 2019-03-20 NOTE — Progress Notes (Signed)
Report given to PACU, vss 

## 2019-03-23 ENCOUNTER — Other Ambulatory Visit: Payer: Self-pay

## 2019-03-24 ENCOUNTER — Telehealth: Payer: Self-pay

## 2019-03-24 NOTE — Telephone Encounter (Signed)
  Follow up Call-  Call back number 03/20/2019  Post procedure Call Back phone  # [phone]  Permission to leave phone message Yes  Some recent data might be hidden     Patient questions:  Do you have a fever, pain , or abdominal swelling? No. Pain Score  0 *  Have you tolerated food without any problems? Yes.    Have you been able to return to your normal activities? Yes.    Do you have any questions about your discharge instructions: Diet   No. Medications  No. Follow up visit  No.  Do you have questions or concerns about your Care? No.  Actions: * If pain score is 4 or above: No action needed, pain <4.  1. Have you developed a fever since your procedure? no  2.   Have you had an respiratory symptoms (SOB or cough) since your procedure? no  3.   Have you tested positive for COVID 19 since your procedure no  4.   Have you had any family members/close contacts diagnosed with the COVID 19 since your procedure?  no   If yes to any of these questions please route to Joylene John, RN and Alphonsa Gin, Therapist, sports.

## 2019-03-26 ENCOUNTER — Other Ambulatory Visit: Payer: Self-pay | Admitting: Medical

## 2019-03-31 ENCOUNTER — Encounter: Payer: 59 | Admitting: Gastroenterology

## 2019-04-02 ENCOUNTER — Other Ambulatory Visit: Payer: Self-pay | Admitting: Medical

## 2019-04-21 ENCOUNTER — Other Ambulatory Visit: Payer: Self-pay | Admitting: Medical

## 2019-05-25 ENCOUNTER — Other Ambulatory Visit: Payer: Self-pay | Admitting: Medical

## 2019-06-01 ENCOUNTER — Telehealth: Payer: Self-pay

## 2019-06-01 DIAGNOSIS — D649 Anemia, unspecified: Secondary | ICD-10-CM

## 2019-06-01 NOTE — Telephone Encounter (Signed)
Copied from [place] [id number]. Topic: Appointment Scheduling - Scheduling Inquiry for Clinic >> May 29, 2019 12:45 PM Scherrie Gerlach wrote: Reason for CRM:  pt states she was to repeat her iron level in a certain time period , but there are no orders. Please advise

## 2019-06-02 NOTE — Telephone Encounter (Signed)
I put in future cbc and iron today. If you can call her and get her scheduled for those test.

## 2019-06-02 NOTE — Addendum Note (Signed)
Addended by: Anabel Halon on: 06/02/2019 02:42 PM   Modules accepted: Orders

## 2019-06-03 ENCOUNTER — Other Ambulatory Visit: Payer: Self-pay

## 2019-06-04 ENCOUNTER — Encounter: Payer: Self-pay | Admitting: Medical

## 2019-06-04 ENCOUNTER — Other Ambulatory Visit (INDEPENDENT_AMBULATORY_CARE_PROVIDER_SITE_OTHER): Payer: 59

## 2019-06-04 DIAGNOSIS — D649 Anemia, unspecified: Secondary | ICD-10-CM

## 2019-06-04 LAB — CBC WITH DIFFERENTIAL/PLATELET
Basophils Absolute: 0 10*3/uL (ref 0.0–0.1)
Basophils Relative: 0.7 % (ref 0.0–3.0)
Eosinophils Absolute: 0.1 10*3/uL (ref 0.0–0.7)
Eosinophils Relative: 1.5 % (ref 0.0–5.0)
HCT: 42.6 % (ref 36.0–46.0)
Hemoglobin: 14.3 g/dL (ref 12.0–15.0)
Lymphocytes Relative: 47.8 % — ABNORMAL HIGH (ref 12.0–46.0)
Lymphs Abs: 2.6 10*3/uL (ref 0.7–4.0)
MCHC: 33.6 g/dL (ref 30.0–36.0)
MCV: 94.8 fl (ref 78.0–100.0)
Monocytes Absolute: 0.3 10*3/uL (ref 0.1–1.0)
Monocytes Relative: 6 % (ref 3.0–12.0)
Neutro Abs: 2.4 10*3/uL (ref 1.4–7.7)
Neutrophils Relative %: 44 % (ref 43.0–77.0)
Platelets: 229 10*3/uL (ref 150.0–400.0)
RBC: 4.49 Mil/uL (ref 3.87–5.11)
RDW: 13.6 % (ref 11.5–15.5)
WBC: 5.5 10*3/uL (ref 4.0–10.5)

## 2019-06-04 LAB — IRON, TOTAL/TOTAL IRON BINDING CAP
%SAT: 34 % (calc) (ref 16–45)
Iron: 87 ug/dL (ref 45–160)
TIBC: 259 mcg/dL (calc) (ref 250–450)

## 2019-06-06 ENCOUNTER — Encounter: Payer: Self-pay | Admitting: Medical

## 2019-06-10 ENCOUNTER — Telehealth: Payer: Self-pay | Admitting: Medical

## 2019-06-10 DIAGNOSIS — D649 Anemia, unspecified: Secondary | ICD-10-CM

## 2019-06-10 NOTE — Telephone Encounter (Signed)
Hx of anemia. On iron bid. Family hx of colon Ca. 2 prep did not clear colon adequate. Pt will decrease her iron to one tab a day her numbers looked really good past 2 years or more.  Will see if she drops significantly. May need to touch base with GI and see if they recommend repeat colonoscopy. How to proceed?  Cbc and iron level 3 weeks.

## 2019-06-17 ENCOUNTER — Encounter: Payer: Self-pay | Admitting: Medical

## 2019-06-18 ENCOUNTER — Encounter: Payer: Self-pay | Admitting: Medical

## 2019-07-01 ENCOUNTER — Other Ambulatory Visit: Payer: Self-pay

## 2019-07-01 ENCOUNTER — Other Ambulatory Visit (INDEPENDENT_AMBULATORY_CARE_PROVIDER_SITE_OTHER): Payer: Self-pay

## 2019-07-01 DIAGNOSIS — D649 Anemia, unspecified: Secondary | ICD-10-CM

## 2019-07-01 LAB — IRON, TOTAL/TOTAL IRON BINDING CAP
%SAT: 39 % (calc) (ref 16–45)
Iron: 103 ug/dL (ref 45–160)
TIBC: 263 mcg/dL (calc) (ref 250–450)

## 2019-07-01 LAB — CBC WITH DIFFERENTIAL/PLATELET
Basophils Absolute: 0 10*3/uL (ref 0.0–0.1)
Basophils Relative: 0.6 % (ref 0.0–3.0)
Eosinophils Absolute: 0.1 10*3/uL (ref 0.0–0.7)
Eosinophils Relative: 1.6 % (ref 0.0–5.0)
HCT: 42.4 % (ref 36.0–46.0)
Hemoglobin: 14.1 g/dL (ref 12.0–15.0)
Lymphocytes Relative: 40.9 % (ref 12.0–46.0)
Lymphs Abs: 2.3 10*3/uL (ref 0.7–4.0)
MCHC: 33.2 g/dL (ref 30.0–36.0)
MCV: 94.5 fl (ref 78.0–100.0)
Monocytes Absolute: 0.3 10*3/uL (ref 0.1–1.0)
Monocytes Relative: 5.9 % (ref 3.0–12.0)
Neutro Abs: 2.9 10*3/uL (ref 1.4–7.7)
Neutrophils Relative %: 51 % (ref 43.0–77.0)
Platelets: 220 10*3/uL (ref 150.0–400.0)
RBC: 4.48 Mil/uL (ref 3.87–5.11)
RDW: 13.3 % (ref 11.5–15.5)
WBC: 5.6 10*3/uL (ref 4.0–10.5)

## 2019-07-07 ENCOUNTER — Other Ambulatory Visit: Payer: Self-pay

## 2019-07-07 ENCOUNTER — Telehealth (INDEPENDENT_AMBULATORY_CARE_PROVIDER_SITE_OTHER): Payer: Self-pay | Admitting: Medical

## 2019-07-07 VITALS — BP 108/78 | Wt 181.0 lb

## 2019-07-07 DIAGNOSIS — D649 Anemia, unspecified: Secondary | ICD-10-CM

## 2019-07-07 DIAGNOSIS — E611 Iron deficiency: Secondary | ICD-10-CM

## 2019-07-07 NOTE — Progress Notes (Signed)
Virtual Visit via Telephone Note  I connected with Garie Barlow on 07/07/19 at  3:00 PM EDT by telephone and verified that I am speaking with the correct person using two identifiers.    Location: Patient: home Provider: office   I discussed the limitations, risks, security and privacy concerns of performing an evaluation and management service by telephone and the availability of in person appointments. I also discussed with the patient that there may be a patient responsible charge related to this service. The patient expressed understanding and agreed to proceed.   History of Present Illness: Patient has history of anemia and on iron.  She has been on iron supplementation in the past that her iron levels included as well as her anemia levels.  On screening colonoscopies she had 2 preps that were not adequate and does not want to repeat studies again.  I wanted to discuss with her a trial of coming off of iron 1 month and then repeating a CBC as well as iron level.  If she has significant drop then I would notify patient and.  And then GI.   Observations/Objective: General no acute distress, pleasant, oriented, normal speech.  Assessment and Plan: With your history of anemia, low iron and inadequate colonoscopies due to prep complications, I do think is a good idea for you to stop iron for a month and then repeat labs.  Also on your repeat labs I want you to do stool cards for blood.  Still positive for blood or if significant drop in lab values and will get opinion from GI MD.  Follow-up in 1 month or as needed.  Follow Up Instructions:    I discussed the assessment and treatment plan with the patient. The patient was provided an opportunity to ask questions and all were answered. The patient agreed with the plan and demonstrated an understanding of the instructions.   The patient was advised to call back or seek an in-person evaluation if the symptoms worsen or if the condition fails  to improve as anticipated.  I provided  minutes of non-face-to-face time during this encounter.   Mackie Pai, PA-C

## 2019-07-07 NOTE — Patient Instructions (Signed)
With your history of anemia, low iron and inadequate colonoscopies due to prep complications, I do think is a good idea for you to stop iron for a month and then repeat labs.  Also on your repeat labs I want you to do stool cards for blood.  Still positive for blood or if significant drop in lab values and will get opinion from GI MD.  Follow-up in 1 month or as needed.

## 2019-07-12 ENCOUNTER — Other Ambulatory Visit: Payer: Self-pay | Admitting: Medical

## 2019-10-13 ENCOUNTER — Other Ambulatory Visit: Payer: Self-pay | Admitting: Medical

## 2019-10-29 ENCOUNTER — Other Ambulatory Visit: Payer: Self-pay | Admitting: Medical

## 2019-11-04 ENCOUNTER — Telehealth: Payer: Self-pay | Admitting: Medical

## 2019-11-04 NOTE — Telephone Encounter (Signed)
Patient called in states that she no longer needs Percell Miller to send this medication below in to Pharmacy   levothyroxine (SYNTHROID) 88 MCG tablet   Her specialists is prescribing it now.

## 2020-01-01 ENCOUNTER — Other Ambulatory Visit: Payer: Self-pay | Admitting: Medical

## 2020-02-11 ENCOUNTER — Other Ambulatory Visit: Payer: Self-pay | Admitting: Obstetrics and Gynecology

## 2020-02-11 DIAGNOSIS — R928 Other abnormal and inconclusive findings on diagnostic imaging of breast: Secondary | ICD-10-CM

## 2020-02-18 ENCOUNTER — Ambulatory Visit
Admission: RE | Admit: 2020-02-18 | Discharge: 2020-02-18 | Disposition: A | Discharge status: Home / Self Care | Payer: No Typology Code available for payment source | Source: Ambulatory Visit | Attending: Obstetrics and Gynecology | Admitting: Obstetrics and Gynecology

## 2020-02-18 ENCOUNTER — Other Ambulatory Visit: Payer: Self-pay

## 2020-02-18 DIAGNOSIS — R928 Other abnormal and inconclusive findings on diagnostic imaging of breast: Secondary | ICD-10-CM

## 2020-03-28 ENCOUNTER — Other Ambulatory Visit: Payer: Self-pay | Admitting: Medical

## 2020-07-07 ENCOUNTER — Other Ambulatory Visit: Payer: Self-pay | Admitting: Medical

## 2020-09-22 ENCOUNTER — Other Ambulatory Visit: Payer: Self-pay

## 2020-09-22 ENCOUNTER — Telehealth: Payer: Self-pay | Admitting: Medical

## 2020-09-22 MED ORDER — ATORVASTATIN CALCIUM 10 MG PO TABS: 10.0000 mg | ORAL_TABLET | Freq: Every day | ORAL | 2 refills | Status: DC

## 2020-09-22 NOTE — Telephone Encounter (Signed)
Pt is overdue for follow up with me. Please schedule visit. Have her come in early morning so can get fasting labs that day. Schedule 3-4 weeks.

## 2020-09-22 NOTE — Telephone Encounter (Signed)
Pt is having to change insurance and come January I can't be her pcp. I tried to remove myself as her pcp. With epic update looks little different but want to make sure I am no longer her pcp. Please confirm I was successful. If I was not then please show me how to remove me as pcp.

## 2020-09-22 NOTE — Telephone Encounter (Signed)
Tried to schedule patient for 1st week of January, patient would be out of town. And her insurance changes in Jan and our office is not covered in her coverage, she will be finding a new PCP

## 2020-09-23 NOTE — Telephone Encounter (Signed)
You did successfully remove yourself as her PCP.

## 2020-12-14 ENCOUNTER — Other Ambulatory Visit: Payer: Self-pay | Admitting: Medical

## 2022-03-21 IMAGING — MG MM DIGITAL DIAGNOSTIC UNILAT*R* W/ TOMO W/ CAD
4 series · 4 of 12 positions shown · non-contrast
Comparison: Previous exams including recent screening mammogram
dated 02/10/2020.

CLINICAL DATA: Patient returns today to evaluate a possible RIGHT
breast mass questioned on recent screening mammogram.

EXAM:
DIGITAL DIAGNOSTIC RIGHT MAMMOGRAM WITH CAD AND TOMO
ULTRASOUND RIGHT BREAST

[R MLO synth-2D]
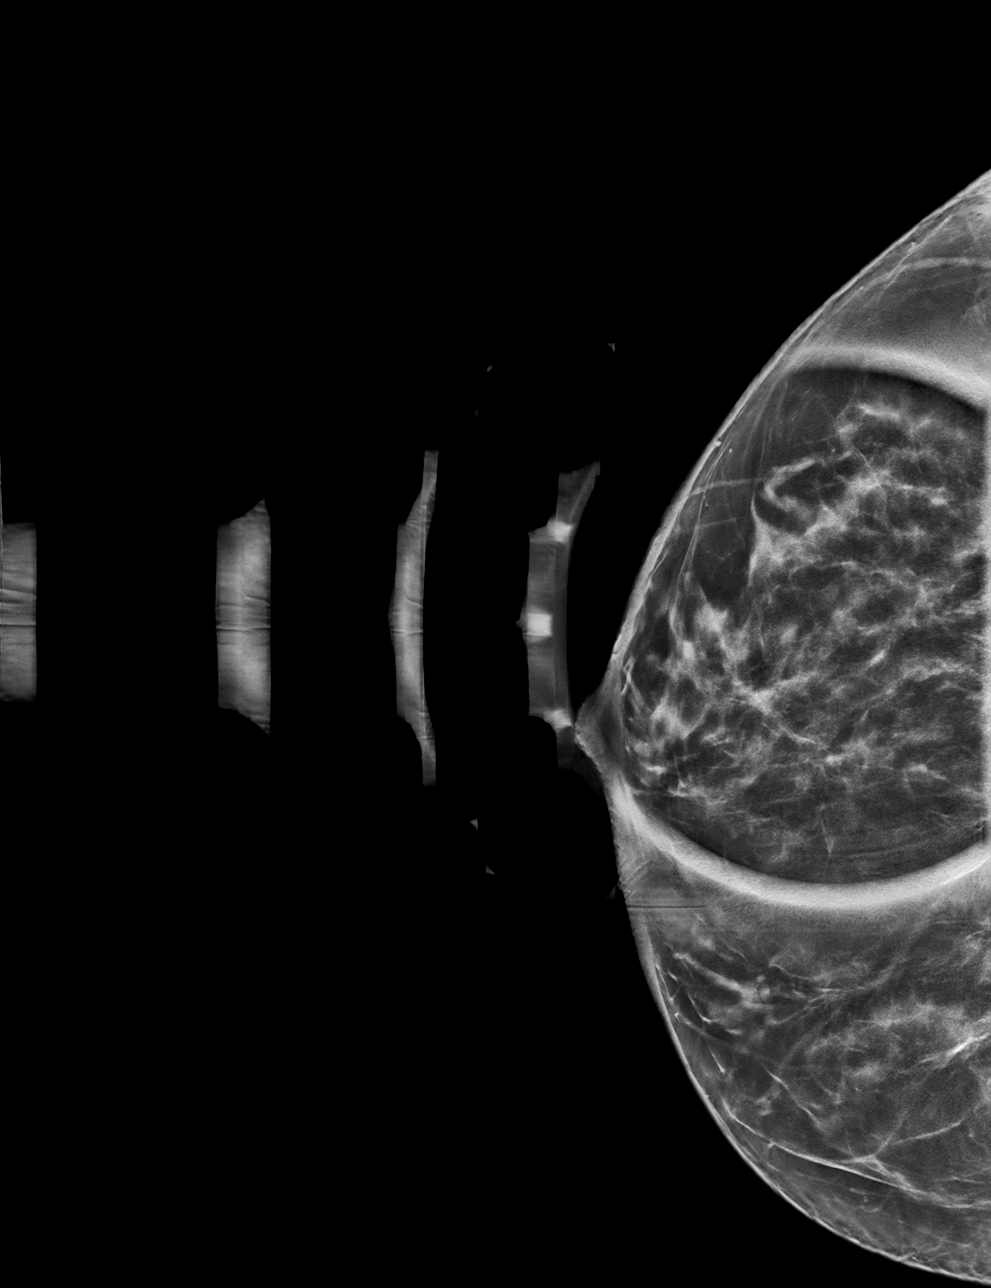

[R CC synth-2D]
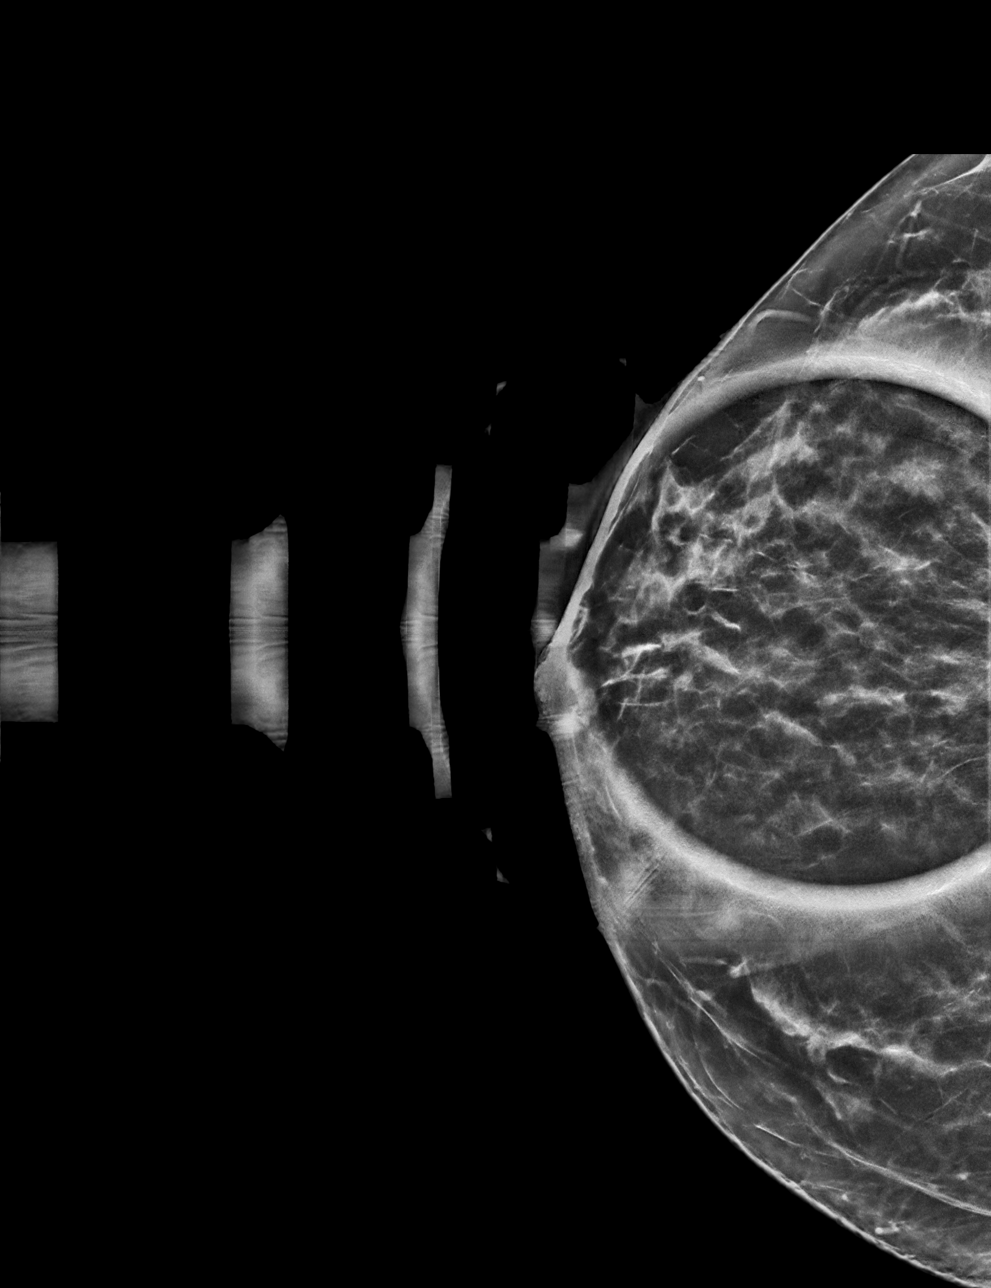

[R CC tomo · tomo slice 31/61.0]
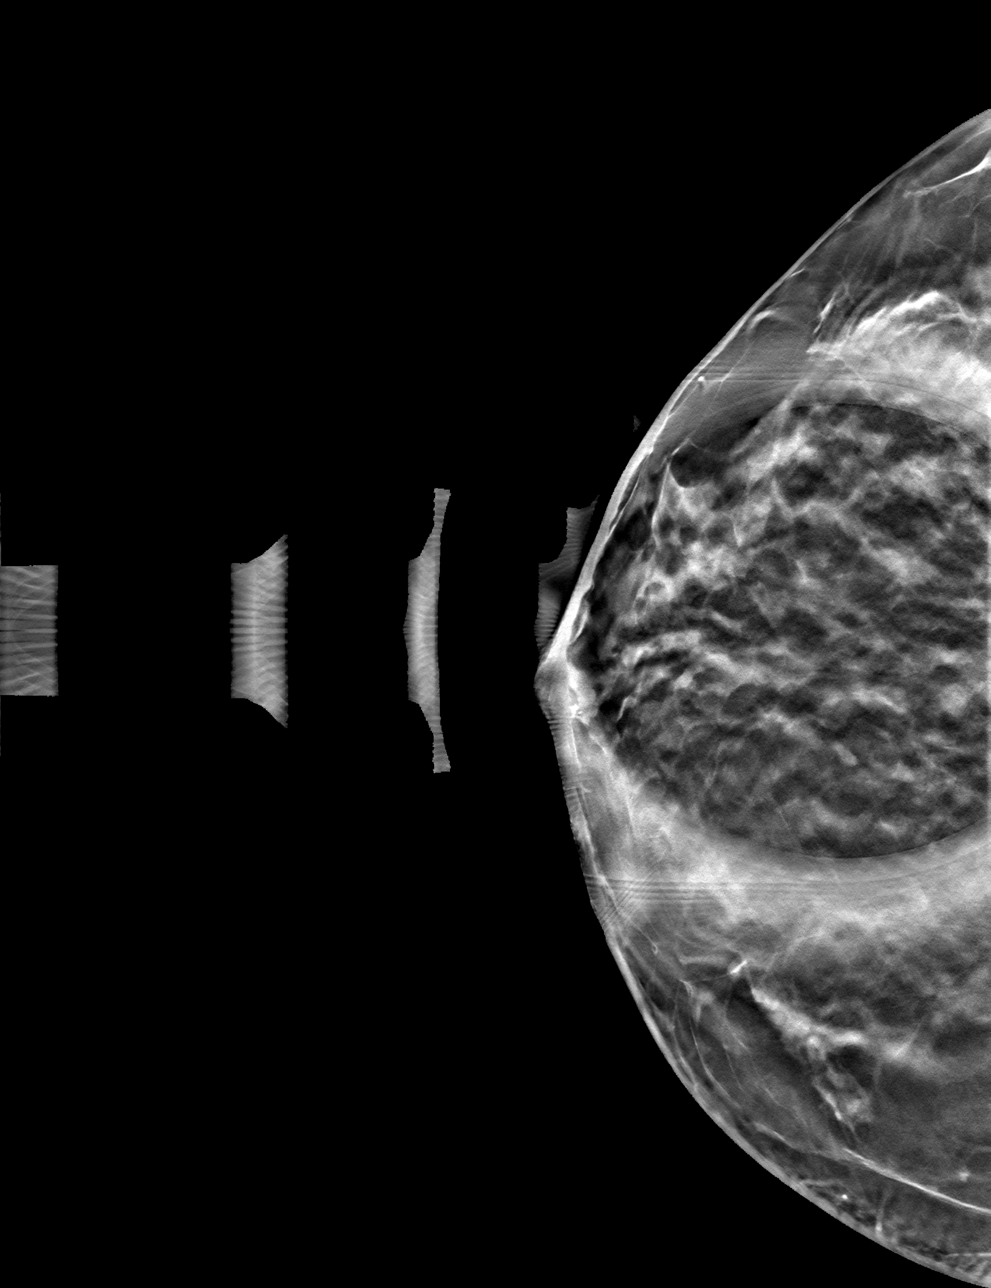

[R MLO tomo · tomo slice 35/70.0]
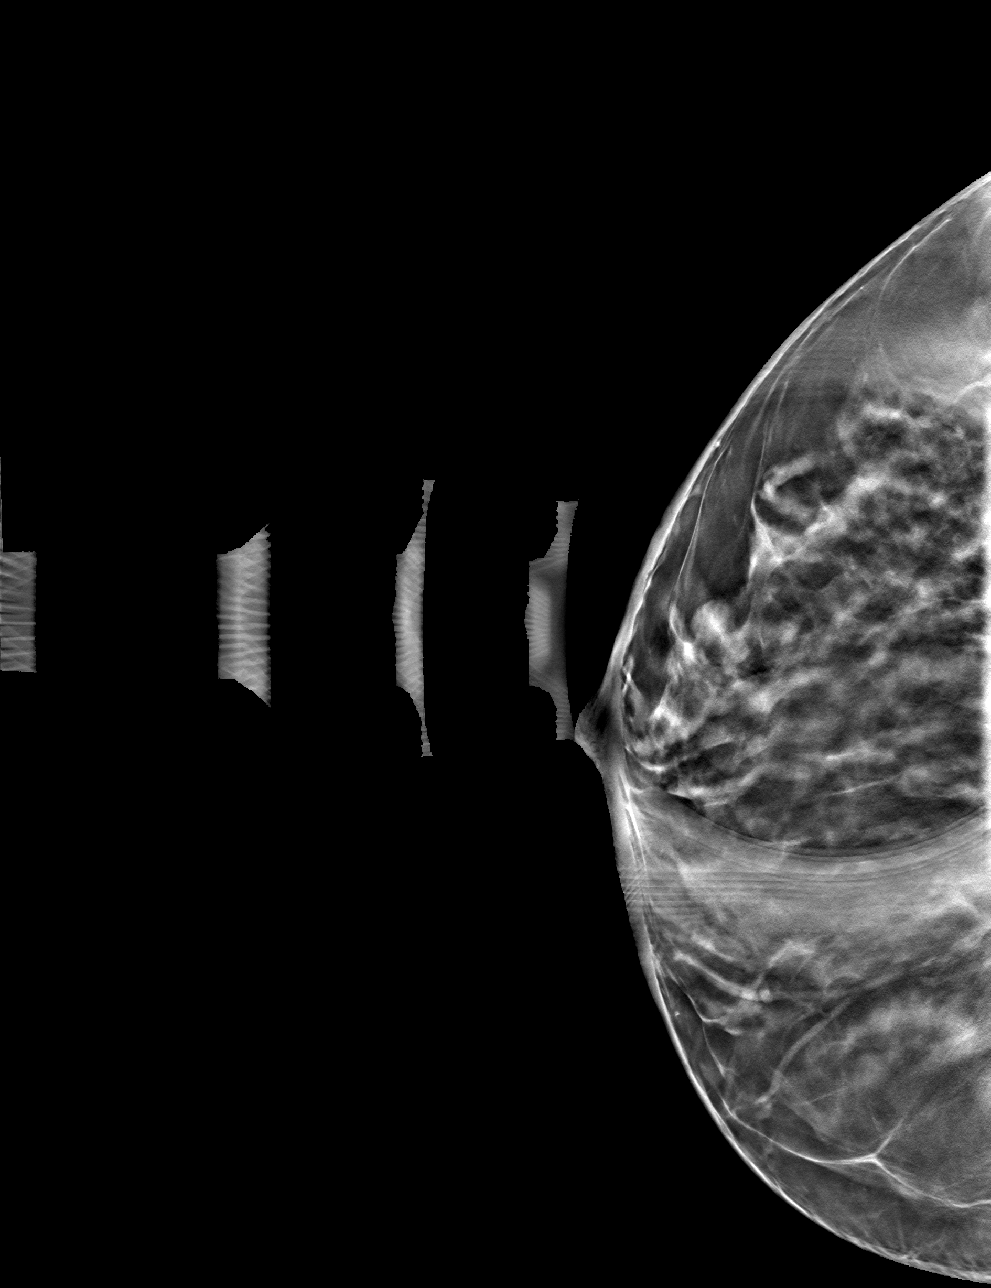

[4 of 12 positions shown; findings below may reference images not displayed]

ACR Breast Density Category c: The breast tissue is heterogeneously
dense, which may obscure small masses.
FINDINGS: On today's additional diagnostic views of the RIGHT breast, with
spot compression and 3D tomosynthesis, there is no persistent
abnormality within the outer RIGHT breast suggesting superimposition
of normal dense fibroglandular tissues. Due to the heterogeneously
dense breast tissues, ultrasound will be performed to ensure
benignity.

Mammographic images were processed with CAD.

Targeted ultrasound is performed, evaluating the outer RIGHT breast,
showing only normal fibroglandular tissues and fat lobules
throughout. No solid or cystic mass. No dilated ducts.
IMPRESSION: No evidence of malignancy. Patient may return to routine annual
bilateral screening mammogram schedule.

RECOMMENDATION:
Screening mammogram in one year.(Code:8K-1-AN0)

I have discussed the findings and recommendations with the patient.
If applicable, a reminder letter will be sent to the patient
regarding the next appointment.

BI-RADS CATEGORY  1: Negative.

## 2022-03-21 IMAGING — US US BREAST*R* LIMITED INC AXILLA
1 series · 4 of 4 positions shown · non-contrast
Comparison: Previous exams including recent screening mammogram
dated 02/10/2020.

CLINICAL DATA: Patient returns today to evaluate a possible RIGHT
breast mass questioned on recent screening mammogram.

EXAM:
DIGITAL DIAGNOSTIC RIGHT MAMMOGRAM WITH CAD AND TOMO
ULTRASOUND RIGHT BREAST

[Series 1: us breast*right* limited inc axilla · 0.07mm/px · 4 of 4 slices shown]
[im 1/4]
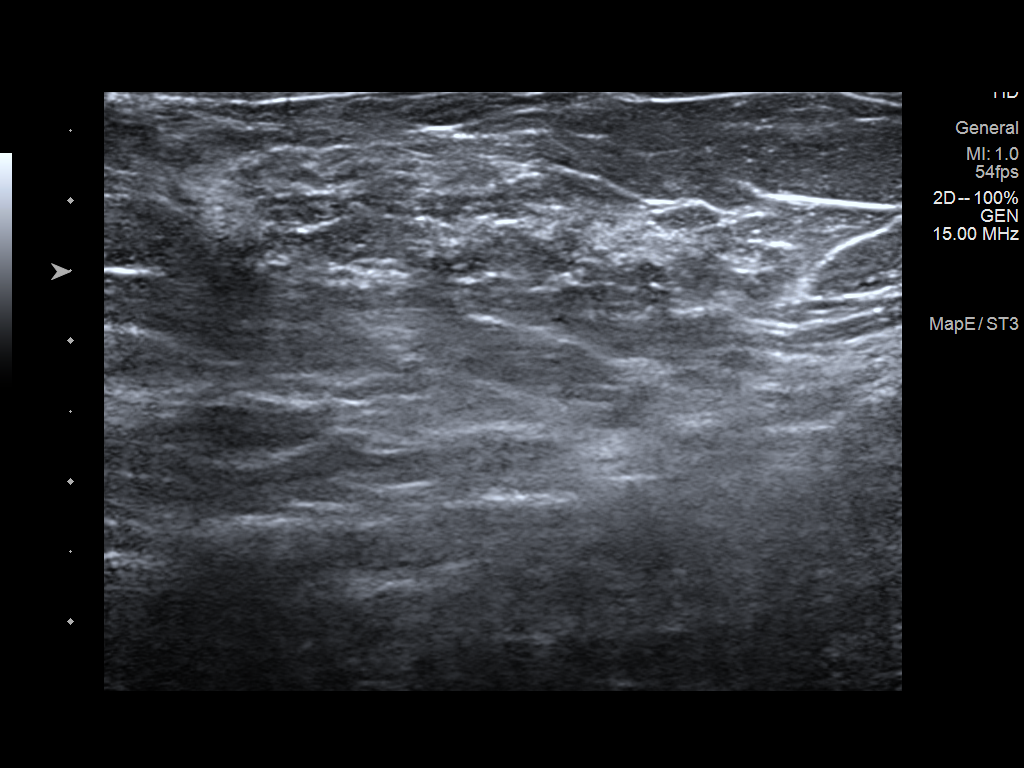
[im 2/4]
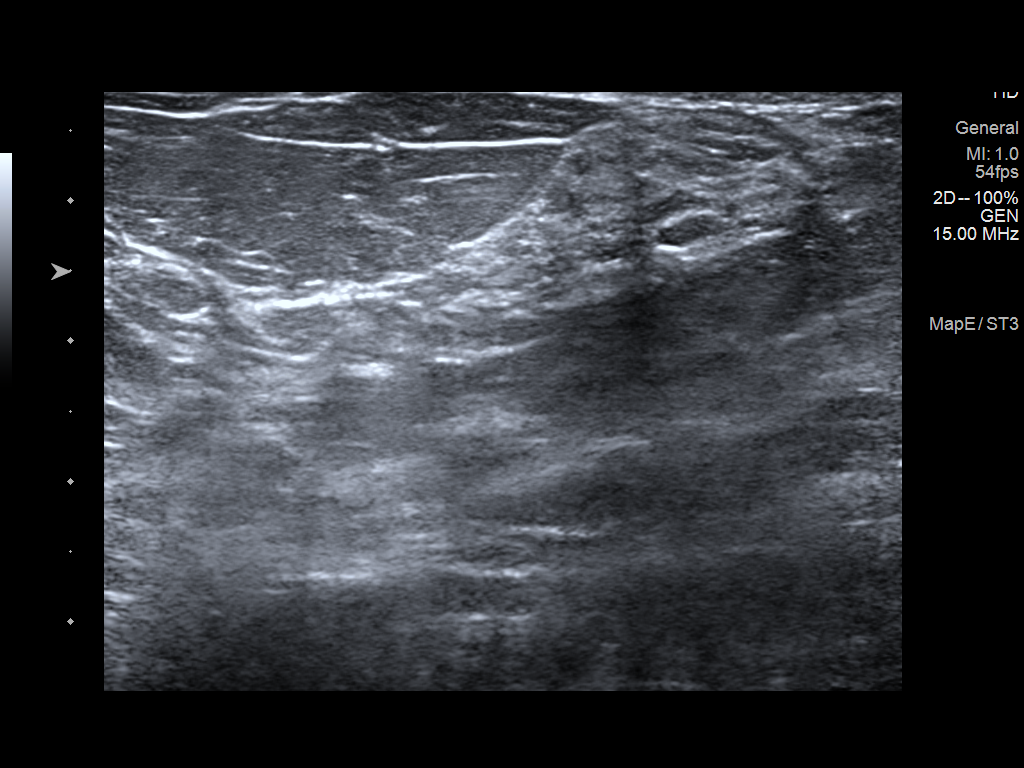
[im 3/4]
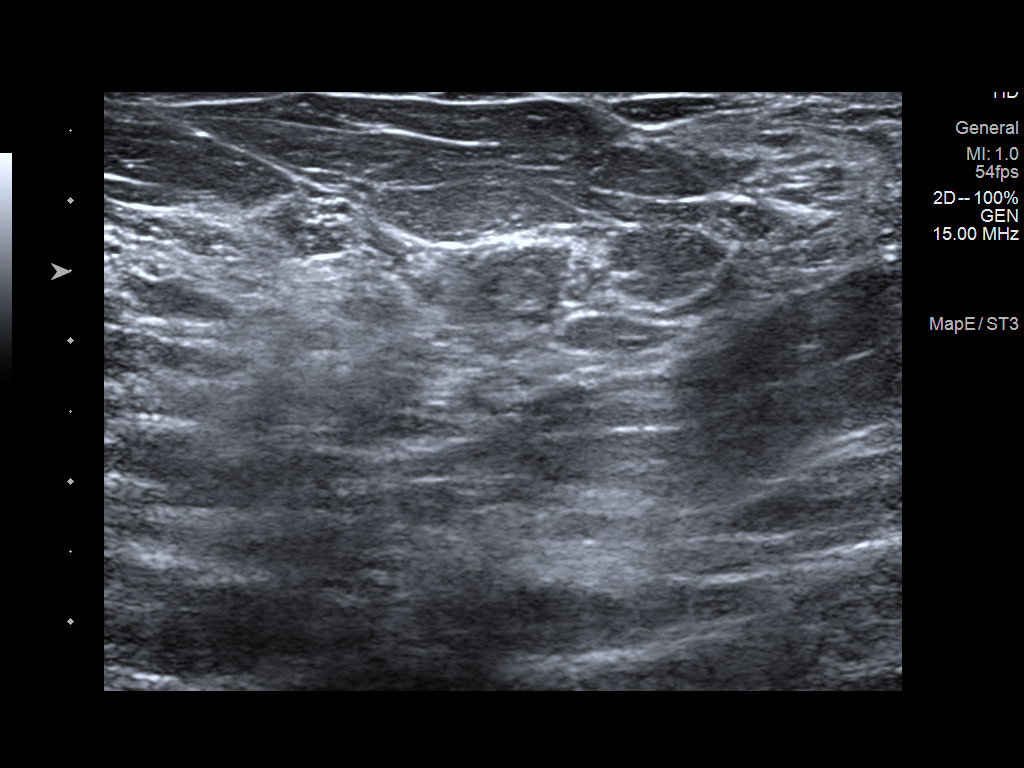
[im 4/4]
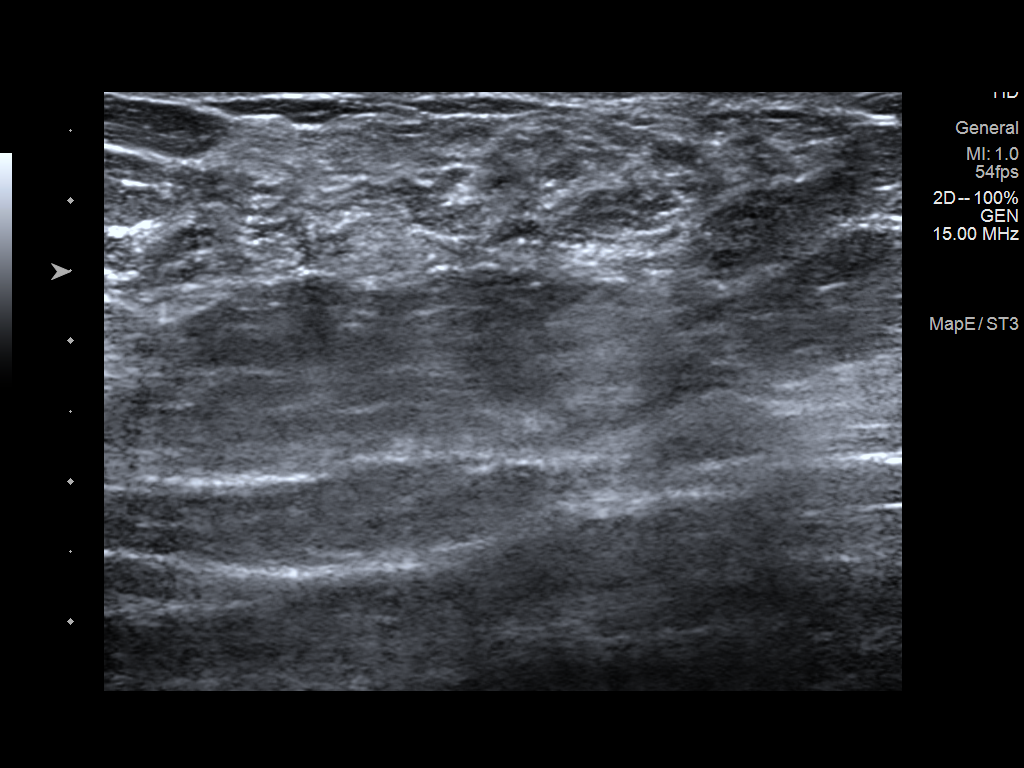

[4 of 4 positions shown; findings below may reference images not displayed]

ACR Breast Density Category c: The breast tissue is heterogeneously
dense, which may obscure small masses.
FINDINGS: On today's additional diagnostic views of the RIGHT breast, with
spot compression and 3D tomosynthesis, there is no persistent
abnormality within the outer RIGHT breast suggesting superimposition
of normal dense fibroglandular tissues. Due to the heterogeneously
dense breast tissues, ultrasound will be performed to ensure
benignity.

Mammographic images were processed with CAD.

Targeted ultrasound is performed, evaluating the outer RIGHT breast,
showing only normal fibroglandular tissues and fat lobules
throughout. No solid or cystic mass. No dilated ducts.
IMPRESSION: No evidence of malignancy. Patient may return to routine annual
bilateral screening mammogram schedule.

RECOMMENDATION:
Screening mammogram in one year.(Code:8K-1-AN0)

I have discussed the findings and recommendations with the patient.
If applicable, a reminder letter will be sent to the patient
regarding the next appointment.

BI-RADS CATEGORY  1: Negative.

## 2025-01-13 ENCOUNTER — Ambulatory Visit: Admitting: Medical
